# Patient Record
Sex: Male | Born: 1975 | Race: White | Hispanic: No | Marital: Married | Smoking: Former smoker
Health system: Southern US, Community
[De-identification: ages and names within clinical notes are randomized; demographics above are authoritative.]

## PROBLEM LIST (undated history)

## (undated) DIAGNOSIS — L089 Local infection of the skin and subcutaneous tissue, unspecified: Secondary | ICD-10-CM

## (undated) DIAGNOSIS — Z800 Family history of malignant neoplasm of digestive organs: Secondary | ICD-10-CM

## (undated) DIAGNOSIS — H7290 Unspecified perforation of tympanic membrane, unspecified ear: Secondary | ICD-10-CM

## (undated) DIAGNOSIS — S060X9A Concussion with loss of consciousness of unspecified duration, initial encounter: Secondary | ICD-10-CM

## (undated) DIAGNOSIS — J329 Chronic sinusitis, unspecified: Secondary | ICD-10-CM

## (undated) DIAGNOSIS — E669 Obesity, unspecified: Secondary | ICD-10-CM

## (undated) DIAGNOSIS — H9201 Otalgia, right ear: Secondary | ICD-10-CM

## (undated) DIAGNOSIS — L739 Follicular disorder, unspecified: Secondary | ICD-10-CM

## (undated) DIAGNOSIS — R079 Chest pain, unspecified: Secondary | ICD-10-CM

## (undated) HISTORY — PX: BACK SURGERY: SHX140

## (undated) HISTORY — DX: Chronic sinusitis, unspecified: J32.9

## (undated) HISTORY — DX: Follicular disorder, unspecified: L73.9

## (undated) HISTORY — DX: Local infection of the skin and subcutaneous tissue, unspecified: L08.9

## (undated) HISTORY — DX: Obesity, unspecified: E66.9

## (undated) HISTORY — DX: Concussion with loss of consciousness of unspecified duration, initial encounter: S06.0X9A

## (undated) HISTORY — DX: Otalgia, right ear: H92.01

## (undated) HISTORY — DX: Chest pain, unspecified: R07.9

## (undated) HISTORY — DX: Family history of malignant neoplasm of digestive organs: Z80.0

## (undated) HISTORY — PX: TYMPANOSTOMY TUBE PLACEMENT: SHX32

## (undated) HISTORY — DX: Unspecified perforation of tympanic membrane, unspecified ear: H72.90

---

## 2001-01-02 HISTORY — PX: BACK SURGERY: SHX140

## 2003-10-06 ENCOUNTER — Encounter: Admission: RE | Admit: 2003-10-06 | Discharge: 2003-10-06 | Payer: Self-pay | Admitting: Family Medicine

## 2004-11-16 ENCOUNTER — Emergency Department (HOSPITAL_COMMUNITY): Admission: EM | Admit: 2004-11-16 | Discharge: 2004-11-17 | Payer: Self-pay | Admitting: Emergency Medicine

## 2006-02-20 ENCOUNTER — Ambulatory Visit: Payer: Self-pay | Admitting: Family Medicine

## 2006-02-23 ENCOUNTER — Ambulatory Visit: Payer: Self-pay | Admitting: Family Medicine

## 2006-04-03 ENCOUNTER — Ambulatory Visit: Payer: Self-pay | Admitting: Gastroenterology

## 2006-04-03 LAB — CONVERTED CEMR LAB
ALT: 42 units/L — ABNORMAL HIGH (ref 0–40)
AST: 26 units/L (ref 0–37)
Albumin: 4.2 g/dL (ref 3.5–5.2)
Alkaline Phosphatase: 88 units/L (ref 39–117)
BUN: 11 mg/dL (ref 6–23)
Basophils Absolute: 0.1 10*3/uL (ref 0.0–0.1)
Basophils Relative: 1 % (ref 0.0–1.0)
Bilirubin, Direct: 0.1 mg/dL (ref 0.0–0.3)
CO2: 29 meq/L (ref 19–32)
Calcium: 10 mg/dL (ref 8.4–10.5)
Chloride: 102 meq/L (ref 96–112)
Creatinine, Ser: 0.8 mg/dL (ref 0.4–1.5)
Eosinophils Absolute: 0.2 10*3/uL (ref 0.0–0.6)
Eosinophils Relative: 3.2 % (ref 0.0–5.0)
GFR calc Af Amer: 143 mL/min
GFR calc non Af Amer: 118 mL/min
Glucose, Bld: 102 mg/dL — ABNORMAL HIGH (ref 70–99)
HCT: 47.8 % (ref 39.0–52.0)
Hemoglobin: 16.9 g/dL (ref 13.0–17.0)
Lymphocytes Relative: 18.5 % (ref 12.0–46.0)
MCHC: 35.2 g/dL (ref 30.0–36.0)
MCV: 91.6 fL (ref 78.0–100.0)
Monocytes Absolute: 0.4 10*3/uL (ref 0.2–0.7)
Monocytes Relative: 7.5 % (ref 3.0–11.0)
Neutro Abs: 4.6 10*3/uL (ref 1.4–7.7)
Neutrophils Relative %: 69.8 % (ref 43.0–77.0)
Platelets: 224 10*3/uL (ref 150–400)
Potassium: 4.9 meq/L (ref 3.5–5.1)
RBC: 5.22 M/uL (ref 4.22–5.81)
RDW: 11.6 % (ref 11.5–14.6)
Sodium: 137 meq/L (ref 135–145)
TSH: 1.41 microintl units/mL (ref 0.35–5.50)
Total Bilirubin: 0.9 mg/dL (ref 0.3–1.2)
Total Protein: 7.4 g/dL (ref 6.0–8.3)
WBC: 6.6 10*3/uL (ref 4.5–10.5)

## 2006-04-09 ENCOUNTER — Ambulatory Visit: Payer: Self-pay | Admitting: Family Medicine

## 2006-04-10 ENCOUNTER — Ambulatory Visit (HOSPITAL_COMMUNITY): Admission: RE | Admit: 2006-04-10 | Discharge: 2006-04-10 | Payer: Self-pay | Admitting: Gastroenterology

## 2006-04-10 ENCOUNTER — Ambulatory Visit: Payer: Self-pay | Admitting: Gastroenterology

## 2006-04-11 ENCOUNTER — Ambulatory Visit (HOSPITAL_COMMUNITY): Admission: RE | Admit: 2006-04-11 | Discharge: 2006-04-11 | Payer: Self-pay | Admitting: Gastroenterology

## 2006-04-18 ENCOUNTER — Ambulatory Visit: Payer: Self-pay | Admitting: Gastroenterology

## 2006-04-18 LAB — CONVERTED CEMR LAB
ALT: 40 units/L (ref 0–40)
AST: 26 units/L (ref 0–37)
Albumin: 4.2 g/dL (ref 3.5–5.2)
Alkaline Phosphatase: 97 units/L (ref 39–117)
Bilirubin, Direct: 0.2 mg/dL (ref 0.0–0.3)
Total Bilirubin: 1.3 mg/dL — ABNORMAL HIGH (ref 0.3–1.2)
Total Protein: 7.6 g/dL (ref 6.0–8.3)

## 2006-04-20 ENCOUNTER — Ambulatory Visit (HOSPITAL_COMMUNITY): Admission: RE | Admit: 2006-04-20 | Discharge: 2006-04-20 | Payer: Self-pay | Admitting: Gastroenterology

## 2006-08-17 DIAGNOSIS — K5730 Diverticulosis of large intestine without perforation or abscess without bleeding: Secondary | ICD-10-CM | POA: Insufficient documentation

## 2006-12-13 DIAGNOSIS — J069 Acute upper respiratory infection, unspecified: Secondary | ICD-10-CM | POA: Insufficient documentation

## 2006-12-16 DIAGNOSIS — H6690 Otitis media, unspecified, unspecified ear: Secondary | ICD-10-CM | POA: Insufficient documentation

## 2006-12-17 ENCOUNTER — Ambulatory Visit: Payer: Self-pay | Admitting: Family Medicine

## 2007-07-30 DIAGNOSIS — R1011 Right upper quadrant pain: Secondary | ICD-10-CM

## 2007-08-20 ENCOUNTER — Ambulatory Visit: Payer: Self-pay | Admitting: Family Medicine

## 2007-08-20 ENCOUNTER — Telehealth: Payer: Self-pay | Admitting: Gastroenterology

## 2007-08-21 ENCOUNTER — Ambulatory Visit: Payer: Self-pay | Admitting: Internal Medicine

## 2007-08-21 DIAGNOSIS — K219 Gastro-esophageal reflux disease without esophagitis: Secondary | ICD-10-CM | POA: Insufficient documentation

## 2007-08-21 DIAGNOSIS — R197 Diarrhea, unspecified: Secondary | ICD-10-CM | POA: Insufficient documentation

## 2007-08-21 DIAGNOSIS — R1031 Right lower quadrant pain: Secondary | ICD-10-CM | POA: Insufficient documentation

## 2007-08-22 ENCOUNTER — Ambulatory Visit: Payer: Self-pay | Admitting: Cardiology

## 2007-09-11 ENCOUNTER — Telehealth (INDEPENDENT_AMBULATORY_CARE_PROVIDER_SITE_OTHER): Payer: Self-pay | Admitting: *Deleted

## 2007-09-11 ENCOUNTER — Ambulatory Visit: Payer: Self-pay | Admitting: Gastroenterology

## 2007-09-13 ENCOUNTER — Encounter (INDEPENDENT_AMBULATORY_CARE_PROVIDER_SITE_OTHER): Payer: Self-pay | Admitting: *Deleted

## 2007-09-16 ENCOUNTER — Encounter: Payer: Self-pay | Admitting: Gastroenterology

## 2008-02-09 DIAGNOSIS — B303 Acute epidemic hemorrhagic conjunctivitis (enteroviral): Secondary | ICD-10-CM

## 2008-02-10 ENCOUNTER — Ambulatory Visit: Payer: Self-pay | Admitting: Family Medicine

## 2008-03-30 ENCOUNTER — Ambulatory Visit: Payer: Self-pay | Admitting: Family Medicine

## 2008-03-30 DIAGNOSIS — L255 Unspecified contact dermatitis due to plants, except food: Secondary | ICD-10-CM

## 2009-01-13 ENCOUNTER — Ambulatory Visit: Payer: Self-pay | Admitting: Family Medicine

## 2009-01-13 DIAGNOSIS — J020 Streptococcal pharyngitis: Secondary | ICD-10-CM

## 2009-01-13 LAB — CONVERTED CEMR LAB: Rapid Strep: POSITIVE

## 2009-08-13 ENCOUNTER — Emergency Department (HOSPITAL_COMMUNITY)
Admission: EM | Admit: 2009-08-13 | Discharge: 2009-08-13 | Payer: Self-pay | Source: Home / Self Care | Admitting: Family Medicine

## 2009-08-16 ENCOUNTER — Telehealth: Payer: Self-pay | Admitting: Family Medicine

## 2010-01-22 ENCOUNTER — Encounter: Payer: Self-pay | Admitting: Family Medicine

## 2010-01-31 ENCOUNTER — Telehealth: Payer: Self-pay | Admitting: Family Medicine

## 2010-02-03 NOTE — Progress Notes (Signed)
Summary: Call A Nurse Report    Triage Call Report Triage Record Num: [id number] Operator: Octaviano Glow Patient Name: Martin Hancock Call Date & Time: 08/13/2009 7:35:59PM Patient Phone: [phone] PCP: Eugenio Hoes. Todd Patient Gender: Male PCP Fax : [phone] Patient DOB: October 27, 1975 Practice Name: [facility] Reason for Call: Pt calling about having an episode while looking at his computer, eyes got blurry with blind spots. Vision changes lasted for about 45-min. When vision returned to normal he got a severe headache behind his right eye. Advised to go to [facility] ED. Protocol(s) Used: Eye: Pain or Vision Change Recommended Outcome per Protocol: See ED Immediately Reason for Outcome: Sudden onset of severe pain in or around or behind eye(s) Care Advice:  ~ Another adult should drive.  ~ Do not give the patient anything to eat or drink.  ~ Wear dark UV-blocking sunglasses to protect eyes from sun exposure or for light sensitivity.  ~ IMMEDIATE ACTION 08/13/2009 7:42:30PM Page 1 of 1 [id number]

## 2010-02-03 NOTE — Assessment & Plan Note (Signed)
Summary: ? strep//ccm   Vital Signs:  Patient profile:   35 year old male Weight:      253 pounds Temp:     98 degrees F Pulse rate:   70 / minute Resp:     12 per minute BP sitting:   108 / 80  Primary Care Provider:  Kelle Darting, MD   History of Present Illness:  Martin Hancock is a 35 year old, married male, nonsmoker, who comes in with a 4-day history of sore throat.  He states his wife was diagnosed as strep throat on Mony.  It's been going to his daughter's daycare, but his child has been well, and strep test on the daughter was negative.  He said, fever, sore throat for 3 days.  No cough.  He's also developed a right-sided earache  Allergies: No Known Drug Allergies  Past History:  Past medical, surgical, family and social histories (including risk factors) reviewed for relevance to current acute and chronic problems.  Past Medical History: Reviewed history from 08/21/2007 and no changes required. Diverticulosis, colon  Past Surgical History: Reviewed history from 08/21/2007 and no changes required. Lumbar surgery  Family History: Reviewed history from 08/21/2007 and no changes required. No FH of Colon Cancer:  Social History: Reviewed history from 08/21/2007 and no changes required. Occupation:bank manager Married Current Smoker Alcohol use-yes Illicit Drug Use - no Daily Caffeine Use Patient gets regular exercise.  Review of Systems      See HPI  Physical Exam  General:  Well-developed,well-nourished,in no acute distress; alert,appropriate and cooperative throughout examination Head:  Normocephalic and atraumatic without obvious abnormalities. No apparent alopecia or balding. Eyes:  No corneal or conjunctival inflammation noted. EOMI. Perrla. Funduscopic exam benign, without hemorrhages, exudates or papilledema. Vision grossly normal. Ears:  right ear shows evidence of previous ruptured tympanic membrane that is healed over.  Left normal Nose:  External  nasal examination shows no deformity or inflammation. Nasal mucosa are pink and moist without lesions or exudates. Mouth:  Oral mucosa and oropharynx without lesions or exudates.  Teeth in good repair. Neck:  No deformities, masses, or tenderness noted. Lungs:  Normal respiratory effort, chest expands symmetrically. Lungs are clear to auscultation, no crackles or wheezes. Cervical Nodes:  No lymphadenopathy noted   Problems:  Medical Problems Added: 1)  Dx of Strep Throat  (ICD-034.0)  Impression & Recommendations:  Problem # 1:  STREP THROAT (ICD-034.0) Assessment New  His updated medication list for this problem includes:    Trimox 500 Mg Caps (Amoxicillin) .Marland Kitchen... Take 1 tablet by mouth three times a day  Orders: Prescription Created Electronically [phone]) Rapid Strep (60737)  Complete Medication List: 1)  Prednisone 20 Mg Tabs (Prednisone) .... Uad 2)  Trimox 500 Mg Caps (Amoxicillin) .... Take 1 tablet by mouth three times a day 3)  Hydromet 5-1.5 Mg/20ml Syrp (Hydrocodone-homatropine) .Marland Kitchen.. 1 or 2 tsps three times a day as needed  Patient Instructions: 1)  Get plenty of rest, drink lots of clear liquids, and use Tylenol or Ibuprofen for fever and comfort. Return in 7-10 days if you're not better:sooner if you're feeling worse. 2)  Take 650-1000mg  of Tylenol every 4-6 hours as needed for relief of pain or comfort of fever AVOID taking more than 4000mg   in a 24 hour period (can cause liver damage in higher doses). 3)  begin amoxicillin, 500 mg 3 times a day for 10 days.  Also, one or 2 teaspoons of Hydromet, up to 3 times a day as  needed for cough and cold symptoms.  Return p.r.n. Prescriptions: TRIMOX 500 MG CAPS (AMOXICILLIN) Take 1 tablet by mouth three times a day  #30 x 0   Entered and Authorized by:   Roderick Pee MD   Signed by:   Roderick Pee MD on 01/13/2009   Method used:   Print then Give to Patient   RxID:   [id number] HYDROMET 5-1.5 MG/5ML SYRP  (HYDROCODONE-HOMATROPINE) 1 or 2 tsps three times a day as needed  #8oz x 1   Entered and Authorized by:   Roderick Pee MD   Signed by:   Roderick Pee MD on 01/13/2009   Method used:   Print then Give to Patient   RxID:   [id number] TRIMOX 500 MG CAPS (AMOXICILLIN) Take 1 tablet by mouth three times a day  #30 x 0   Entered and Authorized by:   Roderick Pee MD   Signed by:   Roderick Pee MD on 01/13/2009   Method used:   Electronically to        [facility] [address] [phone]* (retail)       [address]       [place], [place]  [address]       Ph: [phone] or [phone]       Fax: [phone]   RxID:   [id number]

## 2010-02-03 NOTE — Assessment & Plan Note (Signed)
Summary: ? strep//ccm   Vital Signs:  Patient profile:   35 year old male Weight:      253 pounds Temp:     98.9 degrees F oral BP sitting:   108 / 80  (right arm) Cuff size:   large  Vitals Entered By: Raechel Ache, RN (January 13, 2009 12:13 PM) CC: C/o bad sore throat, R earache.   CC:  C/o bad sore throat and R earache..  Allergies: No Known Drug Allergies   Complete Medication List: 1)  Prednisone 20 Mg Tabs (Prednisone) .... Uad  Other Orders: Rapid Strep (45409) No Charge Patient Arrived (NCPA0) (NCPA0)  Laboratory Results    Other Tests  Rapid Strep: positive Comments: Joanne Chars CMA  January 13, 2009 12:22 PM

## 2010-02-03 NOTE — Letter (Signed)
Summary: Out of Work  Adult nurse at [employer]  [address]   [place], [place] [address]   Phone: [phone]  Fax: [phone]    January 13, 2009   Employee:  MASSIE MEES    To Whom It May Concern:   For Medical reasons, please excuse the above named employee from work for the following dates:  Start:   Monday January 11, 2009  End:   Friday January 15, 2009  If you need additional information, please feel free to contact our office.         Sincerely,    Roderick Pee MD

## 2010-02-09 ENCOUNTER — Encounter: Payer: Self-pay | Admitting: Family Medicine

## 2010-02-09 NOTE — Progress Notes (Signed)
Summary: Pt needs to get an order to have ua test done at [facility]  Phone Note Call from Patient Call back at [phone]    Caller: Patient Summary of Call: Pt called and needs a doctors order to get a ua test for nicotin, at [facility] [address] [phone] Initial call taken by: Lucy Antigua,  January 31, 2010 3:45 PM  Follow-up for Phone Call        ok Follow-up by: Roderick Pee MD,  January 31, 2010 4:58 PM  Additional Follow-up for Phone Call Additional follow up Details #1::        Phone Call Completed Additional Follow-up by: Kern Reap CMA Duncan Dull),  February 01, 2010 1:17 PM  New Problems: SCREENING CHEMICAL POISONING&OTHER CONTAMINATION (ICD-V82.5)   New Problems: SCREENING CHEMICAL POISONING&OTHER CONTAMINATION (ICD-V82.5)

## 2010-02-22 ENCOUNTER — Telehealth: Payer: Self-pay | Admitting: *Deleted

## 2010-02-22 NOTE — Telephone Encounter (Signed)
faxed

## 2010-02-22 NOTE — Telephone Encounter (Signed)
Fax to [phone] [name] Insurance Needs UA results from [facility] back in Jan.  2012.

## 2010-02-22 NOTE — Telephone Encounter (Signed)
Okay to send copy of urinalysis

## 2010-03-18 LAB — GLUCOSE, CAPILLARY: Glucose-Capillary: 88 mg/dL (ref 70–99)

## 2010-05-20 NOTE — Assessment & Plan Note (Signed)
[facility]                         GASTROENTEROLOGY OFFICE NOTE   IMIR, BRUMBACH                     MRN:          [id number]  DATE:04/03/2006                            DOB:          1975/10/13    REASON FOR REFERRAL:  Dr. Tawanna Cooler asked me to evaluate Mr. Dannemiller regarding  diverticulitis, dysphagia.   HISTORY OF PRESENT ILLNESS:  Mr. Klingensmith is a very pleasant 35 year old  man who began having right lower quadrant pains that radiated towards  the left side of his abdomen, approximately 2 months ago.  He had some  hot and cold flashes throughout this, he was mildly nauseous.  He was  quite fatigued.  This lasted for at least 2 or 3 weeks before he  presented to see his primary care physician, Dr. Tawanna Cooler.  Dr. Tawanna Cooler  examined him and felt that he had diverticulitis, put him on a liquid  diet, and started him on Citrucel, as well as oral Cipro and Flagyl.  The pain has gradually improved, and he has weaned himself down to 1  tbsp of Citrucel daily, and he is back on normal diet.  The pains have  gone.  He had a similar attack of right lower quadrant pains 2-3 years  ago.  He had an ultrasound around this time, and it was a normal  examination.  He has not had lab tests or imaging studies since then.   He also complains of intermittent solid food dysphagia.  It happens  approximately once or twice a week, nonprogressive.  He does get  heartburn periodically, about once a month, and Tums seems to help very  well.  He never had overt food impaction.   REVIEW OF SYSTEMS:  Notable for overall stable weight for the past 9-10  months.  The rest of his review of systems is essentially normal, and is  available on his nursing intake sheet.   PAST MEDICAL HISTORY:  Tubes in his ears and a back surgery, otherwise  normal.   CURRENT MEDICATIONS:  Citrucel once daily.   ALLERGIES:  No known drug allergies.   SOCIAL HISTORY:  Married with no children.   Lives with his wife, works  as a Public librarian.  Smokes 1 pack of cigarettes a day, drinks 1-2 beers  a day.   FAMILY HISTORY:  No colon cancer, colon polyps in family.   PHYSICAL EXAMINATION:  Height 6 feet 0 inches, 266 pounds, blood  pressure 122/80, pulse 85.  CONSTITUTIONAL:  Generally well-appearing.  NEUROLOGIC:  Alert and oriented x3.  EYES:  Extraocular movements intact.  MOUTH:  Oropharynx moist, no lesions.  NECK:  Supple, no lymphadenopathy.  CARDIOVASCULAR:  Heart regular rate and rhythm.  LUNGS:  Clear to auscultation bilaterally.  ABDOMEN:  Soft, nontender, nondistended.  Normal bowel sounds.  EXTREMITIES:  No lower extremity edema.  SKIN:  No rashes or lesions on visible extremities.   ASSESSMENT AND PLAN:  A 35 year old man with clinically diagnosed  diverticulitis, intermittent solid food dysphagia.   He may, indeed, have had diverticulitis, although without imaging proof  I remain slightly skeptical about  this.  He is pain free now, and so I  do not think doing a CT scan is worthwhile at this point, but I will  arrange for him to have a colonoscopy to see if he, indeed, has the  presence of diverticulosis.  He has started Citrucel, and that seemed to  even out his bowels a bit.  He is going with less straining overall.  Will also get a set of labs including a CBC, a complete metabolic  profile, and thyroid testing done today.  For his dysphagia, I will  arrange for him for have an upper gastrointestinal evaluation to see if  he has any esophageal stricturing.  If he, indeed, has stricturing, then  we will have to proceed with endoscopy as well.     Rachael Fee, MD  Electronically Signed    DPJ/MedQ  DD: 04/03/2006  DT: 04/03/2006  Job #: [id number]   cc:   Tinnie Gens A. Tawanna Cooler, MD

## 2012-04-11 ENCOUNTER — Encounter (HOSPITAL_COMMUNITY): Payer: Self-pay | Admitting: Emergency Medicine

## 2012-04-11 ENCOUNTER — Emergency Department (INDEPENDENT_AMBULATORY_CARE_PROVIDER_SITE_OTHER): Payer: BC Managed Care – PPO

## 2012-04-11 ENCOUNTER — Emergency Department (INDEPENDENT_AMBULATORY_CARE_PROVIDER_SITE_OTHER)
Admission: EM | Admit: 2012-04-11 | Discharge: 2012-04-11 | Disposition: A | Discharge status: Home / Self Care | Payer: Self-pay | Source: Home / Self Care | Attending: Emergency Medicine | Admitting: Emergency Medicine

## 2012-04-11 DIAGNOSIS — M5412 Radiculopathy, cervical region: Secondary | ICD-10-CM

## 2012-04-11 DIAGNOSIS — R0789 Other chest pain: Secondary | ICD-10-CM

## 2012-04-11 MED ORDER — TRAMADOL HCL 50 MG PO TABS
100.0000 mg | ORAL_TABLET | Freq: Three times a day (TID) | ORAL | Status: DC | PRN
Start: 2012-04-11 — End: 2016-12-12

## 2012-04-11 MED ORDER — METHOCARBAMOL 500 MG PO TABS: 500.0000 mg | ORAL_TABLET | Freq: Three times a day (TID) | ORAL | Status: DC

## 2012-04-11 MED ORDER — MELOXICAM 15 MG PO TABS: 15.0000 mg | ORAL_TABLET | Freq: Every day | ORAL | Status: DC

## 2012-04-11 NOTE — ED Notes (Signed)
Pt c/o right shoulder/back pain onset 2 hours ago Recalls waking up while son was on his chest and noticed pain Pain gradually getting worse; hurts to raise arm above head and pain increases w/activity Reports tingly and numbness of right arm Took 600 mg of ibuprofen about 2 hours ago Denies: inj/trauma  He is alert and oriented w/no signs of acute distress.
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
## 2012-11-07 ENCOUNTER — Other Ambulatory Visit: Payer: Self-pay

## 2015-02-18 ENCOUNTER — Encounter: Payer: Self-pay | Admitting: Gastroenterology

## 2015-04-19 ENCOUNTER — Ambulatory Visit: Payer: Self-pay | Admitting: Gastroenterology

## 2016-09-22 ENCOUNTER — Encounter: Payer: Self-pay | Admitting: Family Medicine

## 2016-12-12 ENCOUNTER — Ambulatory Visit (INDEPENDENT_AMBULATORY_CARE_PROVIDER_SITE_OTHER): Payer: BC Managed Care – PPO

## 2016-12-12 ENCOUNTER — Ambulatory Visit (HOSPITAL_COMMUNITY)
Admission: EM | Admit: 2016-12-12 | Discharge: 2016-12-12 | Disposition: A | Discharge status: Home / Self Care | Payer: BC Managed Care – PPO | Attending: Emergency Medicine | Admitting: Emergency Medicine

## 2016-12-12 ENCOUNTER — Encounter (HOSPITAL_COMMUNITY): Payer: Self-pay | Admitting: Emergency Medicine

## 2016-12-12 ENCOUNTER — Other Ambulatory Visit: Payer: Self-pay

## 2016-12-12 DIAGNOSIS — S8992XA Unspecified injury of left lower leg, initial encounter: Secondary | ICD-10-CM

## 2016-12-12 DIAGNOSIS — M25462 Effusion, left knee: Secondary | ICD-10-CM | POA: Diagnosis not present

## 2016-12-12 MED ORDER — TRAMADOL HCL 50 MG PO TABS: 50.0000 mg | ORAL_TABLET | Freq: Four times a day (QID) | ORAL | 0 refills | Status: DC | PRN

## 2016-12-12 MED ORDER — MELOXICAM 15 MG PO TABS: 15.0000 mg | ORAL_TABLET | Freq: Every day | ORAL | 0 refills | Status: DC

## 2016-12-12 NOTE — ED Provider Notes (Signed)
[facility]    CSN: [id number] Arrival date & time: 12/12/16  1120     History   Chief Complaint Chief Complaint  Patient presents with  . Knee Injury    left    HPI JALEEN FINCH is a 41 y.o. male.   41 year old male comes in with spouse for left knee injury. States he was playing in the snow with his kids yesterday and twisted his left knee. He heard popping sounds from the knee. States he waited for awhile, but was eventually able to weight bear and limp back into the house. He took some NSAIDs with little relief. He has been doing ice compress to help with swelling. States he has some numbness and tingling of his foot that resolves after movement. States painful range of motion and hard to straighten the knee.       History reviewed. No pertinent past medical history.  Patient Active Problem List   Diagnosis Date Noted  . STREP THROAT 01/13/2009  . CONTACT DERMATITIS&OTHER ECZEMA DUE TO PLANTS 03/30/2008  . EPIDEMIC HEMORRHAGIC CONJUNCTIVITIS 02/09/2008  . GERD 08/21/2007  . DIARRHEA 08/21/2007  . ABDOMINAL PAIN-RLQ 08/21/2007  . ABDOMINAL PAIN RIGHT UPPER QUADRANT 07/30/2007  . ROM 12/16/2006  . VIRAL URI 12/13/2006  . DIVERTICULOSIS, COLON 08/17/2006    Past Surgical History:  Procedure Laterality Date  . BACK SURGERY         Home Medications    Prior to Admission medications   Medication Sig Start Date End Date Taking? Authorizing Provider  ibuprofen (ADVIL,MOTRIN) 800 MG tablet Take 800 mg by mouth every 8 (eight) hours as needed.   Yes [provider]  meloxicam (MOBIC) 15 MG tablet Take 1 tablet (15 mg total) by mouth daily. 12/12/16   Tasia Catchings, Amy V, PA-C  traMADol (ULTRAM) 50 MG tablet Take 1 tablet (50 mg total) by mouth every 6 (six) hours as needed. 12/12/16   Ok Edwards, PA-C    Family History Family History  Problem Relation Age of Onset  . Colon cancer Father   . Liver cancer Father   . Diabetes Mother     Social  History Social History   Tobacco Use  . Smoking status: Former Research scientist (life sciences)  . Smokeless tobacco: Never Used  Substance Use Topics  . Alcohol use: Yes    Alcohol/week: 0.0 oz  . Drug use: No     Allergies   Patient has no known allergies.   Review of Systems Review of Systems  Reason unable to perform ROS: See HPI as above.     Physical Exam Triage Vital Signs ED Triage Vitals  Enc Vitals Group     BP 12/12/16 1136 (!) 131/51     Pulse Rate 12/12/16 1136 90     Resp --      Temp 12/12/16 1136 98.5 F (36.9 C)     Temp Source 12/12/16 1136 Oral     SpO2 12/12/16 1136 95 %     Weight --      Height --      Head Circumference --      Peak Flow --      Pain Score 12/12/16 1134 7     Pain Loc --      Pain Edu? --      Excl. in [place]? --    No data found.  Updated Vital Signs BP (!) 131/51 (BP Location: Left Arm)   Pulse 90   Temp  98.5 F (36.9 C) (Oral)   SpO2 95%   Physical Exam  Constitutional: He is oriented to person, place, and time. He appears well-developed and well-nourished. No distress.  HENT:  Head: Normocephalic and atraumatic.  Eyes: Conjunctivae are normal. Pupils are equal, round, and reactive to light.  Musculoskeletal:  Diffuse swelling of the medial left knee. No erythema, increased warmth. Tenderness on palpation along medial joint line. Limited ROM of knee due to pain. Strength testing deferred. Sensation intact and equal bilaterally. Pedal pulses 2+ and equal bilaterally. Cap refill <2s.   Neurological: He is alert and oriented to person, place, and time.     UC Treatments / Results  Labs (all labs ordered are listed, but only abnormal results are displayed) Labs Reviewed - No data to display  EKG  EKG Interpretation None       Radiology Dg Knee Complete 4 Views Left  Result Date: 12/12/2016 CLINICAL DATA:  [place] and twisted knee.  Pain. EXAM: LEFT KNEE - COMPLETE 4+ VIEW COMPARISON:  None. FINDINGS: No fracture or dislocation.  Positive for joint effusion. Soft tissues otherwise unremarkable. IMPRESSION: A joint effusion is present, but no fracture is visualized. Electronically Signed   By: Staci Righter M.D.   On: 12/12/2016 13:31    Procedures Procedures (including critical care time)  Medications Ordered in UC Medications - No data to display   Initial Impression / Assessment and Plan / UC Course  I have reviewed the triage vital signs and the nursing notes.  Pertinent labs & imaging results that were available during my care of the patient were reviewed by me and considered in my medical decision making (see chart for details).    Xray negative for fracture/dislocation, does show joint effusion. Discussed with patient mechanism of injury concerning of meniscus/ligament injuries. Mobic for pain. Tramadol for break through pain. Crutches as needed for weight bearing. Patient to follow up with orthopedics as soon as possible for further evaluation and treatment needed. Resources given, including orthopedic urgent care. Return precautions given.  Discussed case with Dr Marcille Blanco, who agrees to plan.   Final Clinical Impressions(s) / UC Diagnoses   Final diagnoses:  Injury of left knee, initial encounter    ED Discharge Orders        Ordered    meloxicam (MOBIC) 15 MG tablet  Daily     12/12/16 1349    traMADol (ULTRAM) 50 MG tablet  Every 6 hours PRN     12/12/16 1349       Controlled Substance Prescriptions [place] Controlled Substance Registry consulted? Yes, I have consulted the [place] Controlled Substances Registry for this patient, and feel the risk/benefit ratio today is favorable for proceeding with this prescription for a controlled substance.   Ok Edwards, PA-C 12/12/16 1430

## 2016-12-12 NOTE — Discharge Instructions (Signed)
Xray negative for fracture/dislocation. Given mechanism of injury, concerns for meniscus injury, ligament injury. Please follow up with orthopedics as soon as possible for further evaluation and management needed. Mobic as directed. Tramadol for breakthrough pain. Monitor for worsening symptoms, decreased feeling in the foot, changes in foot color, follow up for reevalaution.

## 2016-12-12 NOTE — ED Triage Notes (Signed)
Pt was playing in the snow with his kids when he twisted his left knee.  He states he went one way and the went the other and he heard several popping sounds from the knee.

## 2018-05-01 ENCOUNTER — Other Ambulatory Visit: Payer: Self-pay

## 2018-05-01 ENCOUNTER — Encounter: Payer: Self-pay | Admitting: Family Medicine

## 2018-05-01 ENCOUNTER — Ambulatory Visit (INDEPENDENT_AMBULATORY_CARE_PROVIDER_SITE_OTHER): Payer: BC Managed Care – PPO | Admitting: Family Medicine

## 2018-05-01 VITALS — Ht 72.0 in | Wt 276.0 lb

## 2018-05-01 DIAGNOSIS — Z833 Family history of diabetes mellitus: Secondary | ICD-10-CM | POA: Insufficient documentation

## 2018-05-01 DIAGNOSIS — Z9889 Other specified postprocedural states: Secondary | ICD-10-CM | POA: Insufficient documentation

## 2018-05-01 DIAGNOSIS — M25552 Pain in left hip: Secondary | ICD-10-CM

## 2018-05-01 DIAGNOSIS — Z8049 Family history of malignant neoplasm of other genital organs: Secondary | ICD-10-CM | POA: Insufficient documentation

## 2018-05-01 DIAGNOSIS — W19XXXA Unspecified fall, initial encounter: Secondary | ICD-10-CM

## 2018-05-01 DIAGNOSIS — J3089 Other allergic rhinitis: Secondary | ICD-10-CM | POA: Insufficient documentation

## 2018-05-01 DIAGNOSIS — Z8489 Family history of other specified conditions: Secondary | ICD-10-CM

## 2018-05-01 DIAGNOSIS — Z87891 Personal history of nicotine dependence: Secondary | ICD-10-CM | POA: Diagnosis not present

## 2018-05-01 DIAGNOSIS — Z803 Family history of malignant neoplasm of breast: Secondary | ICD-10-CM | POA: Insufficient documentation

## 2018-05-01 DIAGNOSIS — M79605 Pain in left leg: Secondary | ICD-10-CM

## 2018-05-01 DIAGNOSIS — Z800 Family history of malignant neoplasm of digestive organs: Secondary | ICD-10-CM

## 2018-05-01 DIAGNOSIS — E669 Obesity, unspecified: Secondary | ICD-10-CM | POA: Diagnosis not present
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
## 2018-05-02 ENCOUNTER — Encounter: Payer: Self-pay | Admitting: Sports Medicine

## 2018-05-02 ENCOUNTER — Ambulatory Visit
Admission: RE | Admit: 2018-05-02 | Discharge: 2018-05-02 | Disposition: A | Discharge status: Home / Self Care | Payer: BC Managed Care – PPO | Source: Ambulatory Visit | Attending: Sports Medicine | Admitting: Sports Medicine

## 2018-05-02 ENCOUNTER — Ambulatory Visit: Payer: BC Managed Care – PPO | Admitting: Sports Medicine

## 2018-05-02 VITALS — BP 117/47 | Ht 72.0 in | Wt 280.0 lb

## 2018-05-02 DIAGNOSIS — M25552 Pain in left hip: Secondary | ICD-10-CM

## 2018-05-02 DIAGNOSIS — M79605 Pain in left leg: Secondary | ICD-10-CM

## 2018-05-02 MED ORDER — GABAPENTIN 300 MG PO CAPS: 300.0000 mg | ORAL_CAPSULE | Freq: Every day | ORAL | 0 refills | Status: DC
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
## 2018-05-02 NOTE — Patient Instructions (Signed)
Phylliss Bob, MD [facility] [address] [place], [place] [address] Phone: [phone] Tuesday, May 5th at 130pm

## 2018-10-21 ENCOUNTER — Encounter: Payer: Self-pay | Admitting: Gastroenterology

## 2018-10-21 ENCOUNTER — Other Ambulatory Visit: Payer: Self-pay

## 2018-10-21 ENCOUNTER — Encounter: Payer: Self-pay | Admitting: Family Medicine

## 2018-10-21 ENCOUNTER — Ambulatory Visit (INDEPENDENT_AMBULATORY_CARE_PROVIDER_SITE_OTHER): Payer: BC Managed Care – PPO | Admitting: Family Medicine

## 2018-10-21 VITALS — BP 110/72 | HR 97 | Temp 98.1°F | Resp 16 | Ht 72.0 in | Wt 281.1 lb

## 2018-10-21 DIAGNOSIS — Z23 Encounter for immunization: Secondary | ICD-10-CM

## 2018-10-21 DIAGNOSIS — G5601 Carpal tunnel syndrome, right upper limb: Secondary | ICD-10-CM | POA: Insufficient documentation

## 2018-10-21 DIAGNOSIS — X503XXA Overexertion from repetitive movements, initial encounter: Secondary | ICD-10-CM

## 2018-10-21 DIAGNOSIS — G8929 Other chronic pain: Secondary | ICD-10-CM

## 2018-10-21 DIAGNOSIS — M7581 Other shoulder lesions, right shoulder: Secondary | ICD-10-CM | POA: Insufficient documentation

## 2018-10-21 DIAGNOSIS — M25511 Pain in right shoulder: Secondary | ICD-10-CM | POA: Insufficient documentation

## 2018-10-21 DIAGNOSIS — M7711 Lateral epicondylitis, right elbow: Secondary | ICD-10-CM | POA: Diagnosis not present

## 2018-10-21 DIAGNOSIS — M7541 Impingement syndrome of right shoulder: Secondary | ICD-10-CM | POA: Diagnosis not present

## 2018-10-21 DIAGNOSIS — Z800 Family history of malignant neoplasm of digestive organs: Secondary | ICD-10-CM

## 2018-10-21 MED ORDER — MELOXICAM 7.5 MG PO TABS: 7.5000 mg | ORAL_TABLET | ORAL | 0 refills | Status: DC | PRN

## 2018-10-21 NOTE — Patient Instructions (Signed)
Tennis Elbow    Tennis elbow (lateral epicondylitis) is inflammation of tendons in your outer forearm, near your elbow. Tendons are tissues that connect muscle to bone. When you have tennis elbow, inflammation affects the tendons that you use to bend your wrist and move your hand up. Inflammation occurs in the lower part of the upper arm bone (humerus), where the tendons connect to the bone (lateral epicondyle).  Tennis elbow often affects people who play tennis, but anyone may get the condition from repeatedly extending the wrist or turning the forearm.  What are the causes?  This condition is usually caused by repeatedly extending the wrist, turning the forearm, and using the hands. It can result from sports or work that requires repetitive forearm movements. In some cases, it may be caused by a sudden injury.  What increases the risk?  You are more likely to develop tennis elbow if you play tennis or another racket sport. You also have a higher risk if you frequently use your hands for work. Besides people who play tennis, others at greater risk include:  Musicians.  Carpenters, painters, and plumbers.  Cooks.  Cashiers.  People who work in factories.  Construction workers.  Butchers.  People who use computers.  What are the signs or symptoms?  Symptoms of this condition include:  Pain and tenderness in the forearm and the outer part of the elbow. Pain may be felt only when using the arm, or it may be there all the time.  A burning feeling that starts in the elbow and spreads down the forearm.  A weak grip in the hand.  How is this diagnosed?  This condition may be diagnosed based on:  Your symptoms and medical history.  A physical exam.  X-rays.  MRI.  How is this treated?  Resting and icing your arm is often the first treatment. Your health care provider may also recommend:  Medicines to reduce pain and inflammation. These may be in the form of a pill, topical gels, or shots of a steroid medicine  (cortisone).  An elbow strap to reduce stress on the area.  Physical therapy. This may include massage or exercises.  An elbow brace to restrict the movements that cause symptoms.  If these treatments do not help relieve your symptoms, your health care provider may recommend surgery to remove damaged muscle and reattach healthy muscle to bone.  Follow these instructions at home:  Activity  Rest your elbow and wrist and avoid activities that cause symptoms, as told by your health care provider.  Do physical therapy exercises as instructed.  If you lift an object, lift it with your palm facing up. This reduces stress on your elbow.  Lifestyle  If your tennis elbow is caused by sports, check your equipment and make sure that:  You are using it correctly.  It is the best fit for you.  If your tennis elbow is caused by work or computer use, take frequent breaks to stretch your arm. Talk with your manager about ways to manage your condition at work.  If you have a brace:  Wear the brace or strap as told by your health care provider. Remove it only as told by your health care provider.  Loosen the brace if your fingers tingle, become numb, or turn cold and blue.  Keep the brace clean.  If the brace is not waterproof, ask if you may remove it for bathing. If you must keep the brace on while bathing:    bath or a shower. General instructions   If directed, put ice on the painful area: ? Put ice in a plastic bag. ? Place a towel between your skin and the bag. ? Leave the ice on for 20 minutes, 2-3 times a day.  Take over-the-counter and prescription medicines only as told by your health care provider.  Keep all follow-up visits as told by your health care provider. This is important. Contact a health care provider if:  You have pain that gets worse or does not get better with treatment.   You have numbness or weakness in your forearm, hand, or fingers. Summary  Tennis elbow (lateral epicondylitis) is inflammation of tendons in your outer forearm, near your elbow.  Common symptoms include pain and tenderness in your forearm and the outer part of your elbow.  This condition is usually caused by repeatedly extending your wrist, turning your forearm, and using your hands.  The first treatment is often resting and icing your arm to relieve symptoms. Further treatment may include taking medicine, getting physical therapy, wearing a brace or strap, or having surgery. This information is not intended to replace advice given to you by your health care provider. Make sure you discuss any questions you have with your health care provider. Document Released: 12/19/2004 Document Revised: 09/14/2017 Document Reviewed: 10/03/2016 Elsevier Patient Education  2020 [employer].     Shoulder Impingement Syndrome  Shoulder impingement syndrome is a condition that causes pain when connective tissues (tendons) surrounding the shoulder joint become pinched. These tendons are part of the group of muscles and tissues that help to stabilize the shoulder (rotator cuff). Beneath the rotator cuff is a fluid-filled sac (bursa) that allows the muscles and tendons to glide smoothly. The bursa may become swollen or irritated (bursitis). Bursitis, swelling in the rotator cuff tendons, or both conditions can decrease how much space is under a bone in the shoulder joint (acromion), resulting in impingement. What are the causes? Shoulder impingement syndrome may be caused by bursitis or swelling of the rotator cuff tendons, which may result from:  Repetitive overhead arm movements.  Falling onto the shoulder.  Weakness in the shoulder muscles. What increases the risk? You may be more likely to develop this condition if you:  Play sports that involve throwing, such as baseball.  Participate in sports  such as tennis, volleyball, and swimming.  Work as a Curator, Games developer, or Architect. Some people are also more likely to develop impingement syndrome because of the shape of their acromion bone. What are the signs or symptoms? The main symptom of this condition is pain on the front or side of the shoulder. The pain may:  Get worse when lifting or raising the arm.  Get worse at night.  Wake you up from sleeping.  Feel sharp when the shoulder is moved and then fade to an ache. Other symptoms may include:  Tenderness.  Stiffness.  Inability to raise the arm above shoulder level or behind the body.  Weakness. How is this diagnosed? This condition may be diagnosed based on:  Your symptoms and medical history.  A physical exam.  Imaging tests, such as: ? X-rays. ? MRI. ? Ultrasound. How is this treated? This condition may be treated by:  Resting your shoulder and avoiding all activities that cause pain or put stress on the shoulder.  Icing your shoulder.  NSAIDs to help reduce pain and swelling.  One or more injections of medicines to numb the area and  reduce inflammation.  Physical therapy.  Surgery. This may be needed if nonsurgical treatments have not helped. Surgery may involve repairing the rotator cuff, reshaping the acromion, or removing the bursa. Follow these instructions at home: Managing pain, stiffness, and swelling   If directed, put ice on the injured area. ? Put ice in a plastic bag. ? Place a towel between your skin and the bag. ? Leave the ice on for 20 minutes, 2-3 times a day. Activity  Rest and return to your normal activities as told by your health care provider. Ask your health care provider what activities are safe for you.  Do exercises as told by your health care provider. General instructions  Do not use any products that contain nicotine or tobacco, such as cigarettes, e-cigarettes, and chewing tobacco. These can delay healing.  If you need help quitting, ask your health care provider.  Ask your health care provider when it is safe for you to drive.  Take over-the-counter and prescription medicines only as told by your health care provider.  Keep all follow-up visits as told by your health care provider. This is important. How is this prevented?  Give your body time to rest between periods of activity.  Be safe and responsible while being active. This will help you avoid falls.  Maintain physical fitness, including strength and flexibility. Contact a health care provider if:  Your symptoms have not improved after 1-2 months of treatment and rest.  You cannot lift your arm away from your body. Summary  Shoulder impingement syndrome is a condition that causes pain when connective tissues (tendons) surrounding the shoulder joint become pinched.  The main symptom of this condition is pain on the front or side of the shoulder.  This condition is usually treated with rest, ice, and pain medicines as needed. This information is not intended to replace advice given to you by your health care provider. Make sure you discuss any questions you have with your health care provider. Document Released: 12/19/2004 Document Revised: 04/12/2018 Document Reviewed: 06/13/2017 Elsevier Patient Education  2020 [employer].

## 2018-10-21 NOTE — Progress Notes (Signed)
Impression and Recommendations:    1. Chronic right shoulder pain   2. Rotator cuff tendinitis, right   3. Impingement syndrome of right shoulder   4. Overuse injury   5. Right tennis elbow   6. h/o Carpal tunnel syndrome of right wrist   7. Family history of colon cancer in father- in early 4's   8. Need for immunization against influenza     - Need for influenza vaccination.  Chronic Right Shoulder Pain, Right Tennis Elbow History of Carpal Tunnel Syndrome, Right Wrist - Extensively discussed sx with patient during appointment. - Reviewed prudent practices to alleviate tennis elbow and shoulder pain.  - Conversation held about importance of avoiding activities that trigger pain. - Discussed modifications to avoid repetitive arm movements at work, especially movements above shoulder height. - Encouraged patient to keep his wrist in a neutral position as much as possible, and he will wear braces-carpal tunnel or cock up splint type braces to prevent flexion& extension..  - Reviewed that prudent use of ice may be used to alleviate pain & inflammaition.  Explained this can often be better than NSAIDs as well. - Advised patient regarding use of braces on affected areas.-Tennis elbow brace for his elbow and wrist braces for his wrist pains - Reviewed prudent use of medications such as NSAID's.  - Strongly recommended physical therapy.   - Ambulatory referral to PT placed today.  See orders. - Reviewed that patient may speak with Sports Medicine PRN about any of these concerns since he is already established with Dr. Micheline Chapman and had seen him in the recent past. - Discussed that Sports Medicine will have more options for treatment available to the patient-  i.e injections of the shoulder joint if need be.  - Will continue to monitor. - Extensive education provided and all questions answered.   Recommendations - Advised patient to schedule a CPE with fasting blood work in near  future as he was told in recent past. - Need for repeat colonoscopy given family Hx of father with bowel cancer.  Referral placed today -although reminded patient that these are items that are addressed during his yearly physical which I advised him to do shortly after last office visit  - Extensive discussion held with patient regarding policies and practices here at the clinic.  Answered all questions about care team and health management during appointment.  - Discussed need for patient to continue to obtain management and screenings with all established specialists.  Educated patient at length about the critical importance of keeping health maintenance up to date.  - Participated in lengthy conversation and all questions were answered.    Orders Placed This Encounter  Procedures  . Flu Vaccine QUAD 36+ mos IM  . Ambulatory referral to Gastroenterology  . Ambulatory referral to Physical Therapy    Meds ordered this encounter  Medications  . meloxicam (MOBIC) 7.5 MG tablet    Sig: Take 1-2 tablets (7.5-15 mg total) by mouth as needed for pain.    Dispense:  60 tablet    Refill:  0   Gross side effects, risk and benefits, and alternatives of medications and treatment plan in general discussed with patient.  Patient is aware that all medications have potential side effects and we are unable to predict every side effect or drug-drug interaction that may occur.   Patient will call with any questions prior to using medication if they have concerns.    Expresses verbal understanding  and consents to current therapy and treatment regimen.  No barriers to understanding were identified.  Red flag symptoms and signs discussed in detail.  Patient expressed understanding regarding what to do in case of emergency\urgent symptoms  Please see AVS handed out to patient at the end of our visit for further patient instructions/ counseling done pertaining to today's office visit.   Return for next  avaiable CPE/ yrly physical, come fasting for same day bldwrk.     Note:  This note was prepared with assistance of Dragon voice recognition software. Occasional wrong-word or sound-a-like substitutions may have occurred due to the inherent limitations of voice recognition software.   This document serves as a record of services personally performed by Mellody Dance, DO. It was created on her behalf by Toni Amend, a trained medical scribe. The creation of this record is based on the scribe's personal observations and the provider's statements to them.   I have reviewed the above medical documentation for accuracy and completeness and I concur.  Mellody Dance, DO 10/21/2018 3:05 PM   --------------------------------------------------------------------------------------------------------------------------------------------------------------------------------------------------------------------------------------------    Subjective:     HPI: Martin Hancock is a 43 y.o. male who presents to [place] at [facility] today for issues as discussed below.  Seeing Dr. Micheline Chapman of Sports Medicine.  Notes he was sent to Orthopedic surgeon because when they did the ultrasound, they "found some, but nothing to explain."  He was referred to surgeon who scheduled an MRI, but told to "wait" on that to see if his area of concern healed in the meantime.  In addition, states he was prescribed "two drugs that are pain relievers but also help with healing."  Notes "I was doing the gabapentin and two other ones."  Right Arm Pain, Shoulder, Elbow, Wrist Notes his main concern is that he's been having a lot of pain in his right shoulder, right elbow, and wrist.  "I bought one of those tennis elbow sleeves with gel packs on it, and that helped out a lot; I got the extra large because I did some reading, and thought let's give it a try."  Notes that the sleeve is very restrictive but has  provided some benefits.  At work, he is on the computer all day; travels a lot and carries a 45 lb suitcase wherever he goes.  Says trying to use his left arm more often but he feels pain in his right arm, even in bed.  Says his right arm "falls asleep very easily" if he lies on it at night.  This has been happening for about a year or more.  However, he denies pain that wakes him up in the middle of the night.  Onset:  Started hurting about two months ago.  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  Notes pain hurts more in the anterior aspect of his shoulder, hurts when he does anything overhead or above 90 degrees above shoulder height.  Played baseball, basketball, football, etc., in youth.  Denies history of injury.  Denies radiation down the arm.  Right Elbow  Pain in lateral aspect of his right elbow. Worse with: Picking things up.  Notes he has golfed in the past but "not in a while."  Colonoscopy Has had a colonoscopy 10 years prior.   Wt Readings from  Last 3 Encounters:  10/21/18 281 lb 1.6 oz (127.5 kg)  05/02/18 280 lb (127 kg)  11-May-2018 276 lb (125.2 kg)   BP Readings from Last 3 Encounters:  10/21/18 110/72  05/02/18 (!) 117/47  12/12/16 (!) 131/51   Pulse Readings from Last 3 Encounters:  10/21/18 97  12/12/16 90  04/11/12 86   BMI Readings from Last 3 Encounters:  10/21/18 38.12 kg/m  05/02/18 37.97 kg/m  05/11/18 37.43 kg/m     Patient Care Team    Relationship Specialty Notifications Start End  Mellody Dance, DO PCP - General Family Medicine  2018-05-11   Thurman Coyer, [place] Physician Sports Medicine  10/21/18      Patient Active Problem List   Diagnosis Date Noted  . Obesity (BMI 30-39.9) 11-May-2018    Priority: High  . Environmental and seasonal allergies 2018-05-11    Priority: Medium  .  History of smoking for 6-10 years 05/11/2018    Priority: Low  . Family history of diabetes mellitus (DM) 05/11/18    Priority: Low  . h/o GERD 08/21/2007    Priority: Low  . Chronic right shoulder pain 10/21/2018  . Right tennis elbow 10/21/2018  . h/o Carpal tunnel syndrome of right wrist 10/21/2018  . Impingement syndrome of right shoulder 10/21/2018  . Rotator cuff tendinitis, right 10/21/2018  . Overuse injury 10/21/2018  . Family history of uterine cancer- Mom 05/11/18  . Family history of breast cancer- aunt 05/11/2018  . Family history of colon cancer in father- in early 51's May 11, 2018  . Patient's father is deceased- pt. doesn't know his med hx other than died mid-60's.  05/11/18  . History of back surgery 05-11-2018  . STREP THROAT 01/13/2009  . CONTACT DERMATITIS&OTHER ECZEMA DUE TO PLANTS 03/30/2008  . EPIDEMIC HEMORRHAGIC CONJUNCTIVITIS 02/09/2008  . DIARRHEA 08/21/2007  . ABDOMINAL PAIN-RLQ 08/21/2007  . ABDOMINAL PAIN RIGHT UPPER QUADRANT 07/30/2007  . ROM 12/16/2006  . VIRAL URI 12/13/2006  . DIVERTICULOSIS, COLON 08/17/2006    Past Medical history, Surgical history, Family history, Social history, Allergies and Medications have been entered into the medical record, reviewed and changed as needed.    No outpatient medications have been marked as taking for the 10/21/18 encounter (Office Visit) with Mellody Dance, DO.    Allergies:  No Known Allergies   Review of Systems:  A fourteen system review of systems was performed and found to be positive as per HPI.   Objective:   Blood pressure 110/72, pulse 97, temperature 98.1 F (36.7 C), resp. rate 16, height 6' (1.829 m), weight 281 lb 1.6 oz (127.5 kg), SpO2 98 %. Body mass index is 38.12 kg/m. General:  Well Developed, well nourished, appropriate for stated age.  Neuro:  Alert and oriented,  extra-ocular muscles intact  HEENT:  Normocephalic, atraumatic, neck supple, no carotid bruits  appreciated  Skin:  no gross rash, warm, pink. Cardiac:  RRR, S1 S2 Respiratory:  ECTA B/L and A/P, Not using accessory muscles, speaking in full sentences- unlabored. Vascular:  Ext warm, no cyanosis apprec.; cap RF less 2 sec. Psych:  No HI/SI, judgement and insight good, Euthymic mood. Full Affect.  Right Shoulder:  Anterior shoulder pain with inflammation and pain upon palpation in region of the supraspinatus tendon insertion.   Minimal pain over AC joint or bicipital groove.    Minimal crepitus with extension and flexion however, full range of motion in all planes and strength good with resisted internal rotation, external rotation, abduction,  abduction as well as flexion extension of the shoulder.  Negative Neer and Hawkins sign.  Empty can test was equivocal.   Anterior shoulder on the right with slight bogginess/swelling and slight increased warmth vs the temperature of the Left shoulder/ R upper am. Right Elbow: Tender to palpation at the lateral epicondyle and pain with resistive extension at the wrist as well as pronation of the wrist.  All other exam was within normal limits.  Neurovascularly intact distally with full range of motion and strength of upper extremity

## 2018-11-26 ENCOUNTER — Ambulatory Visit: Payer: Self-pay | Admitting: Gastroenterology

## 2018-12-09 ENCOUNTER — Ambulatory Visit (INDEPENDENT_AMBULATORY_CARE_PROVIDER_SITE_OTHER): Payer: BC Managed Care – PPO | Admitting: Family Medicine

## 2018-12-09 ENCOUNTER — Encounter: Payer: Self-pay | Admitting: Family Medicine

## 2018-12-09 ENCOUNTER — Other Ambulatory Visit: Payer: BC Managed Care – PPO

## 2018-12-09 ENCOUNTER — Other Ambulatory Visit: Payer: Self-pay

## 2018-12-09 VITALS — BP 139/84 | HR 84 | Temp 97.6°F | Resp 12 | Ht 72.0 in | Wt 281.5 lb

## 2018-12-09 DIAGNOSIS — Z800 Family history of malignant neoplasm of digestive organs: Secondary | ICD-10-CM

## 2018-12-09 DIAGNOSIS — E669 Obesity, unspecified: Secondary | ICD-10-CM

## 2018-12-09 DIAGNOSIS — Z833 Family history of diabetes mellitus: Secondary | ICD-10-CM

## 2018-12-09 DIAGNOSIS — Z87891 Personal history of nicotine dependence: Secondary | ICD-10-CM

## 2018-12-09 DIAGNOSIS — Z719 Counseling, unspecified: Secondary | ICD-10-CM | POA: Diagnosis not present

## 2018-12-09 DIAGNOSIS — Z0000 Encounter for general adult medical examination without abnormal findings: Secondary | ICD-10-CM | POA: Diagnosis not present

## 2018-12-09 NOTE — Progress Notes (Signed)
Male physical  Impression and Recommendations:    1. Encounter for wellness examination   2. Health education/counseling   3. Obesity (BMI 30-39.9)   4. Family history of colon cancer in father- in early 27's   5. Family history of diabetes mellitus (DM)   6. History of smoking for 6-10 years      1) Anticipatory Guidance: Discussed importance of wearing a seatbelt while driving, not texting while driving;   sunscreen when outside along with skin surveillance; eating a balanced and modest diet; physical activity at least 25 minutes per day or 150 min/ week moderate to intense activity.  - Prudent self-testicular screening habits discussed with patient today.  - To help avoid need to urinate at night, encouraged patient to avoid drinking water prior to bedtime.  2) Immunizations / Screenings / Labs:   All immunizations are up-to-date per recommendations or will be updated today. Patient is due for dental and vision screens which pt will schedule independently. Will obtain CBC, CMP, HgA1c, Lipid panel, TSH and vit D when fasting, if not already done recently.   - Pt with family history of colon cancer in father. - Patient was recently referred to GI and will be going in near future.  3) Weight - Body mass index is 38.18 kg/m BMI meaning discussed with patient.  Discussed goal of losing 5-10% of current body weight which would improve overall feelings of well being and improve objective health data. Improve nutrient density of diet through increasing intake of fruits and vegetables and decreasing saturated fats, white flour products and refined sugars.   American Heart Association guidelines for healthy diet, basically Mediterranean diet, and exercise guidelines of 30 minutes 5 days per week or more discussed in detail.  - Handout provided today to assist patient with glycemic index.  Health counseling performed.  All questions answered.  - Told patient to utilize the  MyFitnessPal / LoseIt app and carefully track his daily nutritional intake.  4) Lifestyle & Preventative Health Maintenance - Advised patient to continue working toward exercising to improve overall mental, physical, and emotional health.    - Reviewed the "spokes of the wheel" of mood and health management.  Stressed the importance of ongoing prudent habits, including regular exercise, appropriate sleep hygiene, healthful dietary habits, and prayer/meditation to relax.  - Encouraged patient to engage in daily physical activity, especially a formal exercise routine.  Recommended that the patient eventually strive for at least 150 minutes of moderate cardiovascular activity per week according to guidelines established by the [facility].   - Healthy dietary habits encouraged, including low-carb, and high amounts of lean protein in diet.   - Patient should also consume adequate amounts of water.  - Novel Covid -19 counseling done; all questions were answered.   - Current CDC / federal and Delta guidelines reviewed with patient  - Reminded pt of extreme importance of social distancing; wearing a mask when out in public; insensate handwashing and cleaning of surfaces, avoiding unnecessary trips for shopping and avoiding ALL but emergency appts etc. - Told patient to be prepared, not scared; and be smart for the sake of others - Patient will call with any additional concerns    No orders of the defined types were placed in this encounter.   No orders of the defined types were placed in this encounter.    Return for f/up near future, DOXY, to review labs/ go over food log.    Martin Hancock  side effects, risk and benefits, and alternatives of medications discussed with patient.  Patient is aware that all medications have potential side effects and we are unable to predict every side effect or drug-drug interaction that may occur.  Expresses verbal understanding and consents to current therapy plan and  treatment regimen.  Please see AVS handed out to patient at the end of our visit for further patient instructions/ counseling done pertaining to today's office visit.  Follow-up preventative CPE in 1 year. Follow-up office visit - may be needed pending lab work.   - F/up sooner for chronic care management as above and/or prn  This document serves as a record of services personally performed by Mellody Dance, DO. It was created on her behalf by Toni Amend, a trained medical scribe. The creation of this record is based on the scribe's personal observations and the provider's statements to them.   This case required medical decision making of at least moderate complexity. The above documentation has been reviewed to be accurate and was completed by Marjory Sneddon, D.O.      Subjective:   I, Toni Amend, am serving as scribe for Dr. Mellody Dance.  CC: CPE  HPI: Martin Hancock is a 43 y.o. male who presents to [facility] [facility] today for a yearly health maintenance exam.     Health Maintenance Summary Reviewed and updated, unless pt declines services.  Colonoscopy:   Family history of colon cancer in father. Tobacco History Reviewed:  Former smoker, quit in 2011; 15 pack-years reported. Alcohol:    No concerns, no excessive use Exercise Habits:  Notes recently got a rowing machine, and has been exercising.  Says they have an elliptical as well and are "trying to get back into shape."  Says his biggest hurtle is his travelling.  Notes he does try to eat healthfully but is open to reviewing his dietary habits. STD concerns:   None Drug Use:   None Birth control method:  No concerns. Testicular/penile concerns:  No concerns  He continues to travel often for work, including lately to areas of high COVID risk.  Notes he wishes to be healthier on behalf of his family.  Last had his teeth cleaned at the dentist twelve months ago.  Last had  his eyes checked a year ago; he goes once yearly.  Denies skin changes or concerns; "not that I'm aware of."  Denies concerns with urination; states he does get up sometimes in the middle of the night, or 4-5 in the morning, in order to urinate.  This is his norm.  Feels it happens "routinely."   Immunization History  Administered Date(s) Administered  . Influenza,inj,Quad PF,6+ Mos 10/21/2018  . Influenza-Unspecified 09/02/2016    Health Maintenance  Topic Date Due  . TETANUS/TDAP  05/01/2019 (Originally 02/15/1994)  . HIV Screening  05/01/2019 (Originally 02/15/1988)  . INFLUENZA VACCINE  Completed      Wt Readings from Last 3 Encounters:  12/09/18 281 lb 8 oz (127.7 kg)  10/21/18 281 lb 1.6 oz (127.5 kg)  05/02/18 280 lb (127 kg)   BP Readings from Last 3 Encounters:  12/09/18 139/84  10/21/18 110/72  05/02/18 (!) 117/47   Pulse Readings from Last 3 Encounters:  12/09/18 84  10/21/18 97  12/12/16 90    Patient Active Problem List   Diagnosis Date Noted  . Obesity (BMI 30-39.9) 05/01/2018    Priority: High  . Environmental and seasonal allergies 05/01/2018  Priority: Medium  . Family history of colon cancer in father- in early 60's 05-22-18    Priority: Medium  . Patient's father is deceased- pt. doesn't know his med hx other than died mid-60's.  May 22, 2018    Priority: Medium  . History of smoking for 6-10 years 05-22-2018    Priority: Low  . Family history of diabetes mellitus (DM) 2018-05-22    Priority: Low  . h/o GERD 08/21/2007    Priority: Low  . Chronic right shoulder pain 10/21/2018  . Right tennis elbow 10/21/2018  . h/o Carpal tunnel syndrome of right wrist 10/21/2018  . Impingement syndrome of right shoulder 10/21/2018  . Rotator cuff tendinitis, right 10/21/2018  . Overuse injury 10/21/2018  . Family history of uterine cancer- Mom 05/22/18  . Family history of breast cancer- aunt 2018/05/22  . History of back surgery May 22, 2018  . STREP  THROAT 01/13/2009  . CONTACT DERMATITIS&OTHER ECZEMA DUE TO PLANTS 03/30/2008  . EPIDEMIC HEMORRHAGIC CONJUNCTIVITIS 02/09/2008  . DIARRHEA 08/21/2007  . ABDOMINAL PAIN-RLQ 08/21/2007  . ABDOMINAL PAIN RIGHT UPPER QUADRANT 07/30/2007  . ROM 12/16/2006  . VIRAL URI 12/13/2006  . DIVERTICULOSIS, COLON 08/17/2006    No past medical history on file.  Past Surgical History:  Procedure Laterality Date  . BACK SURGERY    . BACK SURGERY  2003  . TYMPANOSTOMY TUBE PLACEMENT      Family History  Problem Relation Age of Onset  . Colon cancer Father   . Liver cancer Father   . Diabetes Mother     Social History   Substance and Sexual Activity  Drug Use No  ,  Social History   Substance and Sexual Activity  Alcohol Use Yes  . Alcohol/week: 14.0 standard drinks  . Types: 14 Standard drinks or equivalent per week  ,  Social History   Tobacco Use  Smoking Status Former Smoker  . Packs/day: 1.00  . Years: 15.00  . Pack years: 15.00  . Quit date: 2011  . Years since quitting: 9.9  Smokeless Tobacco Never Used  ,  Social History   Substance and Sexual Activity  Sexual Activity Yes  . Birth control/protection: None    Patient's Medications  New Prescriptions   No medications on file  Previous Medications   GABAPENTIN (NEURONTIN) 300 MG CAPSULE    Take 1 capsule (300 mg total) by mouth at bedtime.   IBUPROFEN (ADVIL) 200 MG TABLET    Take 200 mg by mouth 4 (four) times daily.   MELOXICAM (MOBIC) 7.5 MG TABLET    Take 1-2 tablets (7.5-15 mg total) by mouth as needed for pain.   NAPROXEN (NAPROSYN) 500 MG TABLET    Take 1 tablet by mouth 2 (two) times daily.   TIZANIDINE (ZANAFLEX) 4 MG CAPSULE    Take 1 capsule by mouth 2 (two) times daily as needed.  Modified Medications   No medications on file  Discontinued Medications   No medications on file    Patient has no known allergies.  Review of Systems: General:   Denies fever, chills, unexplained weight loss.   Optho/Auditory:   Denies visual changes, blurred vision/LOV Respiratory:   Denies SOB, DOE more than baseline levels.   Cardiovascular:   Denies chest pain, palpitations, new onset peripheral edema  Gastrointestinal:   Denies nausea, vomiting, diarrhea.  Genitourinary: Denies dysuria, freq/ urgency, flank pain or discharge from genitals.  Endocrine:     Denies hot or cold intolerance, polyuria, polydipsia. Musculoskeletal:  Denies unexplained myalgias, joint swelling, unexplained arthralgias, gait problems.  Skin:  Denies rash, suspicious lesions Neurological:     Denies dizziness, unexplained weakness, numbness  Psychiatric/Behavioral:   Denies mood changes, suicidal or homicidal ideations, hallucinations    Objective:     Blood pressure 139/84, pulse 84, temperature 97.6 F (36.4 C), temperature source Oral, resp. rate 12, height 6' (1.829 m), weight 281 lb 8 oz (127.7 kg), SpO2 98 %. Body mass index is 38.18 kg/m. General Appearance:    Alert, cooperative, no distress, appears stated age  Head:    Normocephalic, without obvious abnormality, atraumatic  Eyes:    PERRL, conjunctiva/corneas clear, EOM's intact, fundi    benign, both eyes  Ears:    Normal TM's and external ear canals, both ears  Nose:   Nares normal, septum midline, mucosa normal, no drainage    or sinus tenderness  Throat:   Lips w/o lesion, mucosa moist, and tongue normal; teeth and   gums normal  Neck:   Supple, symmetrical, trachea midline, no adenopathy;    thyroid:  no enlargement/tenderness/nodules; no carotid   bruit or JVD  Back:     Symmetric, no curvature, ROM normal, no CVA tenderness  Lungs:     Clear to auscultation bilaterally, respirations unlabored, no       Wh/ R/ R  Chest Wall:    No tenderness or gross deformity; normal excursion   Heart:    Regular rate and rhythm, S1 and S2 normal, no murmur, rub   or gallop  Abdomen:     Soft, non-tender, bowel sounds active all four quadrants, NO   G/R/R,  no masses, no organomegaly  Genitalia:    Ext genitalia: without lesion, no penile rash or discharge, no hernias appreciated   Rectal:    Normal tone, prostate WNL's and equal b/l, no tenderness; guaiac negative stool  Extremities:   Extremities normal, atraumatic, no cyanosis or gross edema  Pulses:   2+ and symmetric all extremities  Skin:   Warm, dry, Skin color, texture, turgor normal, no obvious rashes or lesions  M-Sk:   Ambulates * 4 w/o difficulty, no gross deformities, tone WNL  Neurologic:   CNII-XII intact, normal strength, sensation and reflexes    Throughout Psych:  No HI/SI, judgement and insight good, Euthymic mood. Full Affect.

## 2018-12-09 NOTE — Patient Instructions (Addendum)
GOALS:  Download MyFitnessPal / LoseIt app Track all nutritional intake  Note what percent of fat, protein, and carbs you are consuming  Try to make protein the biggest proportion of your diet      Preventive Care, Male Preventive care refers to lifestyle choices and visits with your health care provider that can promote health and wellness. What does preventive care include?   A yearly physical exam. This is also called an annual well check.  Dental exams once or twice a year.  Routine eye exams. Ask your health care provider how often you should have your eyes checked.  Personal lifestyle choices, including: ? Daily care of your teeth and gums. ? Regular physical activity. ? Eating a healthy diet. ? Avoiding tobacco and drug use. ? Limiting alcohol use. ? Practicing safe sex. ? Taking low doses of aspirin every day. ? Taking vitamin and mineral supplements as recommended by your health care provider. What happens during an annual well check? The services and screenings done by your health care provider during your annual well check will depend on your age, overall health, lifestyle risk factors, and family history of disease. Counseling Your health care provider may ask you questions about your:  Alcohol use.  Tobacco use.  Drug use.  Emotional well-being.  Home and relationship well-being.  Sexual activity.  Eating habits.  History of falls.  Memory and ability to understand (cognition).  Work and work Statistician. Screening You may have the following tests or measurements:  Height, weight, and BMI.  Blood pressure.  Lipid and cholesterol levels. These may be checked every 5 years, or more frequently if you are over 28 years old.  Skin check.  Lung cancer screening. You may have this screening every year starting at age 72 if you have a 30-pack-year history of smoking and currently smoke or have quit within the past 15 years.  Colorectal cancer  screening. All adults should have this screening starting at age 64 and continuing until age 57. You will have tests every 1-10 years, depending on your results and the type of screening test. People at increased risk should start screening at an earlier age. Screening tests may include: ? Guaiac-based fecal occult blood testing. ? Fecal immunochemical test (FIT). ? Stool DNA test. ? Virtual colonoscopy. ? Sigmoidoscopy. During this test, a flexible tube with a tiny camera (sigmoidoscope) is used to examine your rectum and lower colon. The sigmoidoscope is inserted through your anus into your rectum and lower colon. ? Colonoscopy. During this test, a long, thin, flexible tube with a tiny camera (colonoscope) is used to examine your entire colon and rectum.  Prostate cancer screening. Recommendations will vary depending on your family history and other risks.  Hepatitis C blood test.  Hepatitis B blood test.  Sexually transmitted disease (STD) testing.  Diabetes screening. This is done by checking your blood sugar (glucose) after you have not eaten for a while (fasting). You may have this done every 1-3 years.  Abdominal aortic aneurysm (AAA) screening. You may need this if you are a current or former smoker.  Osteoporosis. You may be screened starting at age 50 if you are at high risk. Talk with your health care provider about your test results, treatment options, and if necessary, the need for more tests. Vaccines Your health care provider may recommend certain vaccines, such as:  Influenza vaccine. This is recommended every year.  Tetanus, diphtheria, and acellular pertussis (Tdap, Td) vaccine. You may need a  Td booster every 10 years.  Varicella vaccine. You may need this if you have not been vaccinated.  Zoster vaccine. You may need this after age 44.  Measles, mumps, and rubella (MMR) vaccine. You may need at least one dose of MMR if you were born in 1957 or later. You may also  need a second dose.  Pneumococcal 13-valent conjugate (PCV13) vaccine. One dose is recommended after age 61.  Pneumococcal polysaccharide (PPSV23) vaccine. One dose is recommended after age 62.  Meningococcal vaccine. You may need this if you have certain conditions.  Hepatitis A vaccine. You may need this if you have certain conditions or if you travel or work in places where you may be exposed to hepatitis A.  Hepatitis B vaccine. You may need this if you have certain conditions or if you travel or work in places where you may be exposed to hepatitis B.  Haemophilus influenzae type b (Hib) vaccine. You may need this if you have certain risk factors. Talk to your health care provider about which screenings and vaccines you need and how often you need them. This information is not intended to replace advice given to you by your health care provider. Make sure you discuss any questions you have with your health care provider. Document Released: 01/15/2015 Document Revised: 02/08/2017 Document Reviewed: 10/20/2014 Elsevier Interactive Patient Education  2019 [place] for Adults, Male A healthy lifestyle and preventive care can promote health and wellness. Preventive health guidelines for men include the following key practices:  A routine yearly physical is a good way to check with your health care provider about your health and preventative screening. It is a chance to share any concerns and updates on your health and to receive a thorough exam.  Visit your dentist for a routine exam and preventative care every 6 months. Brush your teeth twice a day and floss once a day. Good oral hygiene prevents tooth decay and gum disease.  The frequency of eye exams is based on your age, health, family medical history, use of contact lenses, and other factors. Follow your health care provider's recommendations for frequency of eye exams.  Eat a healthy diet. Foods such  as vegetables, fruits, whole grains, low-fat dairy products, and lean protein foods contain the nutrients you need without too many calories. Decrease your intake of foods high in solid fats, added sugars, and salt. Eat the right amount of calories for you. Get information about a proper diet from your health care provider, if necessary.  Regular physical exercise is one of the most important things you can do for your health. Most adults should get at least 150 minutes of moderate-intensity exercise (any activity that increases your heart rate and causes you to sweat) each week. In addition, most adults need muscle-strengthening exercises on 2 or more days a week.  Maintain a healthy weight. The body mass index (BMI) is a screening tool to identify possible weight problems. It provides an estimate of body fat based on height and weight. Your health care provider can find your BMI and can help you achieve or maintain a healthy weight. For adults 20 years and older:  A BMI below 18.5 is considered underweight.  A BMI of 18.5 to 24.9 is normal.  A BMI of 25 to 29.9 is considered overweight.  A BMI of 30 and above is considered obese.  Maintain normal blood lipids and cholesterol levels by exercising and  minimizing your intake of saturated fat. Eat a balanced diet with plenty of fruit and vegetables. Blood tests for lipids and cholesterol should begin at age 24 and be repeated every 5 years. If your lipid or cholesterol levels are high, you are over 50, or you are at high risk for heart disease, you may need your cholesterol levels checked more frequently. Ongoing high lipid and cholesterol levels should be treated with medicines if diet and exercise are not working.  If you smoke, find out from your health care provider how to quit. If you do not use tobacco, do not start.  Lung cancer screening is recommended for adults aged 31-80 years who are at high risk for developing lung cancer because of a  history of smoking. A yearly low-dose CT scan of the lungs is recommended for people who have at least a 30-pack-year history of smoking and are a current smoker or have quit within the past 15 years. A pack year of smoking is smoking an average of 1 pack of cigarettes a day for 1 year (for example: 1 pack a day for 30 years or 2 packs a day for 15 years). Yearly screening should continue until the smoker has stopped smoking for at least 15 years. Yearly screening should be stopped for people who develop a health problem that would prevent them from having lung cancer treatment.  If you choose to drink alcohol, do not have more than 2 drinks per day. One drink is considered to be 12 ounces (355 mL) of beer, 5 ounces (148 mL) of wine, or 1.5 ounces (44 mL) of liquor.  Avoid use of street drugs. Do not share needles with anyone. Ask for help if you need support or instructions about stopping the use of drugs.  High blood pressure causes heart disease and increases the risk of stroke. Your blood pressure should be checked at least every 1-2 years. Ongoing high blood pressure should be treated with medicines, if weight loss and exercise are not effective.  If you are 40-57 years old, ask your health care provider if you should take aspirin to prevent heart disease.  Diabetes screening is done by taking a blood sample to check your blood glucose level after you have not eaten for a certain period of time (fasting). If you are not overweight and you do not have risk factors for diabetes, you should be screened once every 3 years starting at age 5. If you are overweight or obese and you are 81-10 years of age, you should be screened for diabetes every year as part of your cardiovascular risk assessment.  Colorectal cancer can be detected and often prevented. Most routine colorectal cancer screening begins at the age of 46 and continues through age 8. However, your health care provider may recommend screening  at an earlier age if you have risk factors for colon cancer. On a yearly basis, your health care provider may provide home test kits to check for hidden blood in the stool. Use of a small camera at the end of a tube to directly examine the colon (sigmoidoscopy or colonoscopy) can detect the earliest forms of colorectal cancer. Talk to your health care provider about this at age 21, when routine screening begins. Direct exam of the colon should be repeated every 5-10 years through age 72, unless early forms of precancerous polyps or small growths are found.  People who are at an increased risk for hepatitis B should be screened for this virus.  You are considered at high risk for hepatitis B if:  You were born in a country where hepatitis B occurs often. Talk with your health care provider about which countries are considered high risk.  Your parents were born in a high-risk country and you have not received a shot to protect against hepatitis B (hepatitis B vaccine).  You have HIV or AIDS.  You use needles to inject street drugs.  You live with, or have sex with, someone who has hepatitis B.  You are a man who has sex with other men (MSM).  You get hemodialysis treatment.  You take certain medicines for conditions such as cancer, organ transplantation, and autoimmune conditions.  Hepatitis C blood testing is recommended for all people born from [birth year] through 1965 and any individual with known risks for hepatitis C.  Practice safe sex. Use condoms and avoid high-risk sexual practices to reduce the spread of sexually transmitted infections (STIs). STIs include gonorrhea, chlamydia, syphilis, trichomonas, herpes, HPV, and human immunodeficiency virus (HIV). Herpes, HIV, and HPV are viral illnesses that have no cure. They can result in disability, cancer, and death.  If you are a man who has sex with other men, you should be screened at least once per year for:  HIV.  Urethral, rectal, and  pharyngeal infection of gonorrhea, chlamydia, or both.  If you are at risk of being infected with HIV, it is recommended that you take a prescription medicine daily to prevent HIV infection. This is called preexposure prophylaxis (PrEP). You are considered at risk if:  You are a man who has sex with other men (MSM) and have other risk factors.  You are a heterosexual man, are sexually active, and are at increased risk for HIV infection.  You take drugs by injection.  You are sexually active with a partner who has HIV.  Talk with your health care provider about whether you are at high risk of being infected with HIV. If you choose to begin PrEP, you should first be tested for HIV. You should then be tested every 3 months for as long as you are taking PrEP.  A one-time screening for abdominal aortic aneurysm (AAA) and surgical repair of large AAAs by ultrasound are recommended for men ages 26 to 93 years who are current or former smokers.  Healthy men should no longer receive prostate-specific antigen (PSA) blood tests as part of routine cancer screening. Talk with your health care provider about prostate cancer screening.  Testicular cancer screening is not recommended for adult males who have no symptoms. Screening includes self-exam, a health care provider exam, and other screening tests. Consult with your health care provider about any symptoms you have or any concerns you have about testicular cancer.  Use sunscreen. Apply sunscreen liberally and repeatedly throughout the day. You should seek shade when your shadow is shorter than you. Protect yourself by wearing long sleeves, pants, a wide-brimmed hat, and sunglasses year round, whenever you are outdoors.  Once a month, do a whole-body skin exam, using a mirror to look at the skin on your back. Tell your health care provider about new moles, moles that have irregular borders, moles that are larger than a pencil eraser, or moles that have  changed in shape or color.  Stay current with required vaccines (immunizations).  Influenza vaccine. All adults should be immunized every year.  Tetanus, diphtheria, and acellular pertussis (Td, Tdap) vaccine. An adult who has not previously received Tdap or who does not  know his vaccine status should receive 1 dose of Tdap. This initial dose should be followed by tetanus and diphtheria toxoids (Td) booster doses every 10 years. Adults with an unknown or incomplete history of completing a 3-dose immunization series with Td-containing vaccines should begin or complete a primary immunization series including a Tdap dose. Adults should receive a Td booster every 10 years.  Varicella vaccine. An adult without evidence of immunity to varicella should receive 2 doses or a second dose if he has previously received 1 dose.  Human papillomavirus (HPV) vaccine. Males aged 11-21 years who have not received the vaccine previously should receive the 3-dose series. Males aged 22-26 years may be immunized. Immunization is recommended through the age of 80 years for any male who has sex with males and did not get any or all doses earlier. Immunization is recommended for any person with an immunocompromised condition through the age of 41 years if he did not get any or all doses earlier. During the 3-dose series, the second dose should be obtained 4-8 weeks after the first dose. The third dose should be obtained 24 weeks after the first dose and 16 weeks after the second dose.  Zoster vaccine. One dose is recommended for adults aged 84 years or older unless certain conditions are present.  Measles, mumps, and rubella (MMR) vaccine. Adults born before [birth year] generally are considered immune to measles and mumps. Adults born in [birth year] or later should have 1 or more doses of MMR vaccine unless there is a contraindication to the vaccine or there is laboratory evidence of immunity to each of the three diseases. A routine second  dose of MMR vaccine should be obtained at least 28 days after the first dose for students attending postsecondary schools, health care workers, or international travelers. People who received inactivated measles vaccine or an unknown type of measles vaccine during 1963-1967 should receive 2 doses of MMR vaccine. People who received inactivated mumps vaccine or an unknown type of mumps vaccine before 1979 and are at high risk for mumps infection should consider immunization with 2 doses of MMR vaccine. Unvaccinated health care workers born before 60 who lack laboratory evidence of measles, mumps, or rubella immunity or laboratory confirmation of disease should consider measles and mumps immunization with 2 doses of MMR vaccine or rubella immunization with 1 dose of MMR vaccine.  Pneumococcal 13-valent conjugate (PCV13) vaccine. When indicated, a person who is uncertain of his immunization history and has no record of immunization should receive the PCV13 vaccine. All adults 66 years of age and older should receive this vaccine. An adult aged 68 years or older who has certain medical conditions and has not been previously immunized should receive 1 dose of PCV13 vaccine. This PCV13 should be followed with a dose of pneumococcal polysaccharide (PPSV23) vaccine. Adults who are at high risk for pneumococcal disease should obtain the PPSV23 vaccine at least 8 weeks after the dose of PCV13 vaccine. Adults older than 45 years of age who have normal immune system function should obtain the PPSV23 vaccine dose at least 1 year after the dose of PCV13 vaccine.  Pneumococcal polysaccharide (PPSV23) vaccine. When PCV13 is also indicated, PCV13 should be obtained first. All adults aged 54 years and older should be immunized. An adult younger than age 23 years who has certain medical conditions should be immunized. Any person who resides in a nursing home or long-term care facility should be immunized. An adult smoker should  be immunized. People  with an immunocompromised condition and certain other conditions should receive both PCV13 and PPSV23 vaccines. People with human immunodeficiency virus (HIV) infection should be immunized as soon as possible after diagnosis. Immunization during chemotherapy or radiation therapy should be avoided. Routine use of PPSV23 vaccine is not recommended for American Indians, [place]atives, or people younger than 65 years unless there are medical conditions that require PPSV23 vaccine. When indicated, people who have unknown immunization and have no record of immunization should receive PPSV23 vaccine. One-time revaccination 5 years after the first dose of PPSV23 is recommended for people aged 19-64 years who have chronic kidney failure, nephrotic syndrome, asplenia, or immunocompromised conditions. People who received 1-2 doses of PPSV23 before age 75 years should receive another dose of PPSV23 vaccine at age 76 years or later if at least 5 years have passed since the previous dose. Doses of PPSV23 are not needed for people immunized with PPSV23 at or after age 11 years.  Meningococcal vaccine. Adults with asplenia or persistent complement component deficiencies should receive 2 doses of quadrivalent meningococcal conjugate (MenACWY-D) vaccine. The doses should be obtained at least 2 months apart. Microbiologists working with certain meningococcal bacteria, [place] recruits, people at risk during an outbreak, and people who travel to or live in countries with a high rate of meningitis should be immunized. A first-year college student up through age 39 years who is living in a residence hall should receive a dose if he did not receive a dose on or after his 16th birthday. Adults who have certain high-risk conditions should receive one or more doses of vaccine.  Hepatitis A vaccine. Adults who wish to be protected from this disease, have chronic liver disease, work with hepatitis A-infected animals,  work in hepatitis A research labs, or travel to or work in countries with a high rate of hepatitis A should be immunized. Adults who were previously unvaccinated and who anticipate close contact with an international adoptee during the first 60 days after arrival in the [place] from a country with a high rate of hepatitis A should be immunized.  Hepatitis B vaccine. Adults should be immunized if they wish to be protected from this disease, are under age 2 years and have diabetes, have chronic liver disease, have had more than one sex partner in the past 6 months, may be exposed to blood or other infectious body fluids, are household contacts or sex partners of hepatitis B positive people, are clients or workers in certain care facilities, or travel to or work in countries with a high rate of hepatitis B.  Haemophilus influenzae type b (Hib) vaccine. A previously unvaccinated person with asplenia or sickle cell disease or having a scheduled splenectomy should receive 1 dose of Hib vaccine. Regardless of previous immunization, a recipient of a hematopoietic stem cell transplant should receive a 3-dose series 6-12 months after his successful transplant. Hib vaccine is not recommended for adults with HIV infection. Preventive Service / Frequency Ages 31 to 44  Blood pressure check.** / Every 3-5 years.  Lipid and cholesterol check.** / Every 5 years beginning at age 60.  Hepatitis C blood test.** / For any individual with known risks for hepatitis C.  Skin self-exam. / Monthly.  Influenza vaccine. / Every year.  Tetanus, diphtheria, and acellular pertussis (Tdap, Td) vaccine.** / Consult your health care provider. 1 dose of Td every 10 years.  Varicella vaccine.** / Consult your health care provider.  HPV vaccine. / 3 doses over 6 months,  if 26 or younger.  Measles, mumps, rubella (MMR) vaccine.** / You need at least 1 dose of MMR if you were born in 1957 or later. You may also need a  second dose.  Pneumococcal 13-valent conjugate (PCV13) vaccine.** / Consult your health care provider.  Pneumococcal polysaccharide (PPSV23) vaccine.** / 1 to 2 doses if you smoke cigarettes or if you have certain conditions.  Meningococcal vaccine.** / 1 dose if you are age 59 to 44 years and a Market researcher living in a residence hall, or have one of several medical conditions. You may also need additional booster doses.  Hepatitis A vaccine.** / Consult your health care provider.  Hepatitis B vaccine.** / Consult your health care provider.  Haemophilus influenzae type b (Hib) vaccine.** / Consult your health care provider. Ages 50 to 55  Blood pressure check.** / Every year.  Lipid and cholesterol check.** / Every 5 years beginning at age 46.  Lung cancer screening. / Every year if you are aged 59-80 years and have a 30-pack-year history of smoking and currently smoke or have quit within the past 15 years. Yearly screening is stopped once you have quit smoking for at least 15 years or develop a health problem that would prevent you from having lung cancer treatment.  Fecal occult blood test (FOBT) of stool. / Every year beginning at age 8 and continuing until age 26. You may not have to do this test if you get a colonoscopy every 10 years.  Flexible sigmoidoscopy** or colonoscopy.** / Every 5 years for a flexible sigmoidoscopy or every 10 years for a colonoscopy beginning at age 81 and continuing until age 28.  Hepatitis C blood test.** / For all people born from [birth year] through 1965 and any individual with known risks for hepatitis C.  Skin self-exam. / Monthly.  Influenza vaccine. / Every year.  Tetanus, diphtheria, and acellular pertussis (Tdap/Td) vaccine.** / Consult your health care provider. 1 dose of Td every 10 years.  Varicella vaccine.** / Consult your health care provider.  Zoster vaccine.** / 1 dose for adults aged 78 years or older.  Measles, mumps,  rubella (MMR) vaccine.** / You need at least 1 dose of MMR if you were born in 1957 or later. You may also need a second dose.  Pneumococcal 13-valent conjugate (PCV13) vaccine.** / Consult your health care provider.  Pneumococcal polysaccharide (PPSV23) vaccine.** / 1 to 2 doses if you smoke cigarettes or if you have certain conditions.  Meningococcal vaccine.** / Consult your health care provider.  Hepatitis A vaccine.** / Consult your health care provider.  Hepatitis B vaccine.** / Consult your health care provider.  Haemophilus influenzae type b (Hib) vaccine.** / Consult your health care provider. Ages 47 and over  Blood pressure check.** / Every year.  Lipid and cholesterol check.**/ Every 5 years beginning at age 42.  Lung cancer screening. / Every year if you are aged 60-80 years and have a 30-pack-year history of smoking and currently smoke or have quit within the past 15 years. Yearly screening is stopped once you have quit smoking for at least 15 years or develop a health problem that would prevent you from having lung cancer treatment.  Fecal occult blood test (FOBT) of stool. / Every year beginning at age 40 and continuing until age 78. You may not have to do this test if you get a colonoscopy every 10 years.  Flexible sigmoidoscopy** or colonoscopy.** / Every 5 years for a flexible sigmoidoscopy or  every 10 years for a colonoscopy beginning at age 89 and continuing until age 4.  Hepatitis C blood test.** / For all people born from [birth year] through 1967 and any individual with known risks for hepatitis C.  Abdominal aortic aneurysm (AAA) screening.** / A one-time screening for ages 30 to 49 years who are current or former smokers.  Skin self-exam. / Monthly.  Influenza vaccine. / Every year.  Tetanus, diphtheria, and acellular pertussis (Tdap/Td) vaccine.** / 1 dose of Td every 10 years.  Varicella vaccine.** / Consult your health care provider.  Zoster vaccine.** / 1  dose for adults aged 40 years or older.  Pneumococcal 13-valent conjugate (PCV13) vaccine.** / 1 dose for all adults aged 27 years and older.  Pneumococcal polysaccharide (PPSV23) vaccine.** / 1 dose for all adults aged 15 years and older.  Meningococcal vaccine.** / Consult your health care provider.  Hepatitis A vaccine.** / Consult your health care provider.  Hepatitis B vaccine.** / Consult your health care provider.  Haemophilus influenzae type b (Hib) vaccine.** / Consult your health care provider. **Family history and personal history of risk and conditions may change your health care provider's recommendations.   This information is not intended to replace advice given to you by your health care provider. Make sure you discuss any questions you have with your health care provider.   Document Released: 02/14/2001 Document Revised: 01/09/2014 Document Reviewed: 05/16/2010 Elsevier Interactive Patient Education 2016 [place] factors for prediabetes and type 2 diabetes  Researchers don't fully understand why some people develop prediabetes and type 2 diabetes and others don't.  It's clear that certain factors increase the risk, however, including:  Weight. The more fatty tissue you have, the more resistant your cells become to insulin.  Inactivity. The less active you are, the greater your risk. Physical activity helps you control your weight, uses up glucose as energy and makes your cells more sensitive to insulin.  Family history. Your risk increases if a parent or sibling has type 2 diabetes.  Race. Although it's unclear why, people of certain races -- including blacks, Hispanics, American Indians and Asian-Americans -- are at higher risk.  Age. Your risk increases as you get older. This may be because you tend to exercise less, lose muscle mass and gain weight as you age. But type 2 diabetes is also increasing dramatically among children, adolescents and  younger adults.  Gestational diabetes. If you developed gestational diabetes when you were pregnant, your risk of developing prediabetes and type 2 diabetes later increases. If you gave birth to a baby weighing more than 9 pounds (4 kilograms), you're also at risk of type 2 diabetes.  Polycystic ovary syndrome. For women, having polycystic ovary syndrome -- a common condition characterized by irregular menstrual periods, excess hair growth and obesity -- increases the risk of diabetes.  High blood pressure. Having blood pressure over 140/90 millimeters of mercury (mm Hg) is linked to an increased risk of type 2 diabetes.  Abnormal cholesterol and triglyceride levels. If you have low levels of high-density lipoprotein (HDL), or "good," cholesterol, your risk of type 2 diabetes is higher. Triglycerides are another type of fat carried in the blood. People with high levels of triglycerides have an increased risk of type 2 diabetes. Your doctor can let you know what your cholesterol and triglyceride levels are.  A good guide to good carbs: The glycemic index ---If you have diabetes, or at risk for diabetes, you  know all too well that when you eat carbohydrates, your blood sugar goes up. The total amount of carbs you consume at a meal or in a snack mostly determines what your blood sugar will do. But the food itself also plays a role. A serving of white rice has almost the same effect as eating pure table sugar -- a quick, high spike in blood sugar. A serving of lentils has a slower, smaller effect.  ---Picking good sources of carbs can help you control your blood sugar and your weight. Even if you don't have diabetes, eating healthier carbohydrate-rich foods can help ward off a host of chronic conditions, from heart disease to various cancers to, well, diabetes.  ---One way to choose foods is with the glycemic index (GI). This tool measures how much a food boosts blood sugar.  The glycemic index rates the  effect of a specific amount of a food on blood sugar compared with the same amount of pure glucose. A food with a glycemic index of 28 boosts blood sugar only 28% as much as pure glucose. One with a GI of 95 acts like pure glucose.    High glycemic foods result in a quick spike in insulin and blood sugar (also known as blood glucose).  Low glycemic foods have a slower, smaller effect- these are healthier for you.   Using the glycemic index Using the glycemic index is easy: choose foods in the low GI category instead of those in the high GI category (see below), and go easy on those in between. Low glycemic index (GI of 55 or less): Most fruits and vegetables, beans, minimally processed grains, pasta, low-fat dairy foods, and nuts.  Moderate glycemic index (GI 56 to 69): White and sweet potatoes, corn, white rice, couscous, breakfast cereals such as Cream of Wheat and Mini Wheats.  High glycemic index (GI of 70 or higher): White bread, rice cakes, most crackers, bagels, cakes, doughnuts, croissants, most packaged breakfast cereals. You can see the values for 100 commons foods and get links to more at www.health.CheapToothpicks.si.  Swaps for lowering glycemic index  Instead of this high-glycemic index food Eat this lower-glycemic index food  White rice Brown rice or converted rice  Instant oatmeal Steel-cut oats  Cornflakes Bran flakes  Baked potato Pasta, bulgur  White bread Whole-grain bread  Corn Peas or leafy greens       Prediabetes Eating Plan  Prediabetes--also called impaired glucose tolerance or impaired fasting glucose--is a condition that causes blood sugar (blood glucose) levels to be higher than normal. Following a healthy diet can help to keep prediabetes under control. It can also help to lower the risk of type 2 diabetes and heart disease, which are increased in people who have prediabetes. Along with regular exercise, a healthy diet:  Promotes weight loss.  Helps to  control blood sugar levels.  Helps to improve the way that the body uses insulin.   WHAT DO I NEED TO KNOW ABOUT THIS EATING PLAN?   Use the glycemic index (GI) to plan your meals. The index tells you how quickly a food will raise your blood sugar. Choose low-GI foods. These foods take a longer time to raise blood sugar.  Pay close attention to the amount of carbohydrates in the food that you eat. Carbohydrates increase blood sugar levels.  Keep track of how many calories you take in. Eating the right amount of calories will help you to achieve a healthy weight. Losing about 7 percent of  your starting weight can help to prevent type 2 diabetes.  You may want to follow a Mediterranean diet. This diet includes a lot of vegetables, lean meats or fish, whole grains, fruits, and healthy oils and fats.   WHAT FOODS CAN I EAT?  Grains Whole grains, such as whole-wheat or whole-grain breads, crackers, cereals, and pasta. Unsweetened oatmeal. Bulgur. Barley. Quinoa. Brown rice. Corn or whole-wheat flour tortillas or taco shells. Vegetables Lettuce. Spinach. Peas. Beets. Cauliflower. Cabbage. Broccoli. Carrots. Tomatoes. Squash. Eggplant. Herbs. Peppers. Onions. Cucumbers. Brussels sprouts. Fruits Berries. Bananas. Apples. Oranges. Grapes. Papaya. Mango. Pomegranate. Kiwi. Grapefruit. Cherries. Meats and Other Protein Sources Seafood. Lean meats, such as chicken and [place] or lean cuts of pork and beef. Tofu. Eggs. Nuts. Beans. Dairy Low-fat or fat-free dairy products, such as yogurt, cottage cheese, and cheese. Beverages Water. Tea. Coffee. Sugar-free or diet soda. Seltzer water. Milk. Milk alternatives, such as soy or almond milk. Condiments Mustard. Relish. Low-fat, low-sugar ketchup. Low-fat, low-sugar barbecue sauce. Low-fat or fat-free mayonnaise. Sweets and Desserts Sugar-free or low-fat pudding. Sugar-free or low-fat ice cream and other frozen treats. Fats and Oils Avocado. Walnuts.  Olive oil. The items listed above may not be a complete list of recommended foods or beverages. Contact your dietitian for more options.    WHAT FOODS ARE NOT RECOMMENDED?  Grains Refined white flour and flour products, such as bread, pasta, snack foods, and cereals. Beverages Sweetened drinks, such as sweet iced tea and soda. Sweets and Desserts Baked goods, such as cake, cupcakes, pastries, cookies, and cheesecake. The items listed above may not be a complete list of foods and beverages to avoid. Contact your dietitian for more information.   This information is not intended to replace advice given to you by your health care provider. Make sure you discuss any questions you have with your health care provider.   Document Released: 05/05/2014 Document Reviewed: 05/05/2014 Elsevier Interactive Patient Education [employer].

## 2018-12-10 ENCOUNTER — Other Ambulatory Visit: Payer: Self-pay | Admitting: Family Medicine

## 2018-12-10 LAB — CBC WITH DIFFERENTIAL/PLATELET
Basophils Absolute: 0.1 10*3/uL (ref 0.0–0.2)
Basos: 1 %
EOS (ABSOLUTE): 0.2 10*3/uL (ref 0.0–0.4)
Eos: 4 %
Hematocrit: 45.7 % (ref 37.5–51.0)
Hemoglobin: 15.7 g/dL (ref 13.0–17.7)
Immature Grans (Abs): 0 10*3/uL (ref 0.0–0.1)
Immature Granulocytes: 0 %
Lymphocytes Absolute: 1.3 10*3/uL (ref 0.7–3.1)
Lymphs: 21 %
MCH: 31.5 pg (ref 26.6–33.0)
MCHC: 34.4 g/dL (ref 31.5–35.7)
MCV: 92 fL (ref 79–97)
Monocytes Absolute: 0.6 10*3/uL (ref 0.1–0.9)
Monocytes: 9 %
Neutrophils Absolute: 4.1 10*3/uL (ref 1.4–7.0)
Neutrophils: 65 %
Platelets: 226 10*3/uL (ref 150–450)
RBC: 4.98 x10E6/uL (ref 4.14–5.80)
RDW: 11.8 % (ref 11.6–15.4)
WBC: 6.3 10*3/uL (ref 3.4–10.8)

## 2018-12-10 LAB — T4, FREE: Free T4: 1.39 ng/dL (ref 0.82–1.77)

## 2018-12-10 LAB — COMPREHENSIVE METABOLIC PANEL
ALT: 20 IU/L (ref 0–44)
AST: 15 IU/L (ref 0–40)
Albumin/Globulin Ratio: 1.6 (ref 1.2–2.2)
Albumin: 4.4 g/dL (ref 4.0–5.0)
Alkaline Phosphatase: 105 IU/L (ref 39–117)
BUN/Creatinine Ratio: 19 (ref 9–20)
BUN: 16 mg/dL (ref 6–24)
Bilirubin Total: 0.7 mg/dL (ref 0.0–1.2)
CO2: 24 mmol/L (ref 20–29)
Calcium: 9.2 mg/dL (ref 8.7–10.2)
Chloride: 102 mmol/L (ref 96–106)
Creatinine, Ser: 0.83 mg/dL (ref 0.76–1.27)
GFR calc Af Amer: 123 mL/min/{1.73_m2} (ref >59)
GFR calc non Af Amer: 106 mL/min/{1.73_m2} (ref >59)
Globulin, Total: 2.7 g/dL (ref 1.5–4.5)
Glucose: 103 mg/dL — ABNORMAL HIGH (ref 65–99)
Potassium: 4.4 mmol/L (ref 3.5–5.2)
Sodium: 140 mmol/L (ref 134–144)
Total Protein: 7.1 g/dL (ref 6.0–8.5)

## 2018-12-10 LAB — LIPID PANEL
Chol/HDL Ratio: 3 ratio (ref 0.0–5.0)
Cholesterol, Total: 160 mg/dL (ref 100–199)
HDL: 53 mg/dL (ref >39)
LDL Chol Calc (NIH): 93 mg/dL (ref 0–99)
Triglycerides: 74 mg/dL (ref 0–149)
VLDL Cholesterol Cal: 14 mg/dL (ref 5–40)

## 2018-12-10 LAB — T3: T3, Total: 115 ng/dL (ref 71–180)

## 2018-12-10 LAB — VITAMIN D 25 HYDROXY (VIT D DEFICIENCY, FRACTURES): Vit D, 25-Hydroxy: 12 ng/mL — ABNORMAL LOW (ref 30.0–100.0)

## 2018-12-10 LAB — HEMOGLOBIN A1C
Est. average glucose Bld gHb Est-mCnc: 103 mg/dL
Hgb A1c MFr Bld: 5.2 % (ref 4.8–5.6)

## 2018-12-10 LAB — TSH: TSH: 1.56 u[IU]/mL (ref 0.450–4.500)

## 2018-12-10 MED ORDER — VITAMIN D (ERGOCALCIFEROL) 1.25 MG (50000 UNIT) PO CAPS: 50000.0000 [IU] | ORAL_CAPSULE | ORAL | 3 refills | Status: DC

## 2018-12-10 NOTE — Telephone Encounter (Signed)
Medication sent to pharmacy per Dr. Raliegh Scarlet request. Please see labs. AS, CMA

## 2018-12-17 ENCOUNTER — Encounter: Payer: Self-pay | Admitting: Family Medicine

## 2018-12-19 ENCOUNTER — Ambulatory Visit: Payer: BC Managed Care – PPO | Attending: Internal Medicine

## 2018-12-19 DIAGNOSIS — Z20822 Contact with and (suspected) exposure to covid-19: Secondary | ICD-10-CM

## 2018-12-20 LAB — NOVEL CORONAVIRUS, NAA: SARS-CoV-2, NAA: NOT DETECTED

## 2018-12-23 ENCOUNTER — Encounter: Payer: Self-pay | Admitting: Family Medicine

## 2018-12-23 ENCOUNTER — Ambulatory Visit (INDEPENDENT_AMBULATORY_CARE_PROVIDER_SITE_OTHER): Payer: BC Managed Care – PPO | Admitting: Family Medicine

## 2018-12-23 ENCOUNTER — Other Ambulatory Visit: Payer: Self-pay

## 2018-12-23 VITALS — Ht 72.0 in | Wt 272.0 lb

## 2018-12-23 DIAGNOSIS — Z833 Family history of diabetes mellitus: Secondary | ICD-10-CM

## 2018-12-23 DIAGNOSIS — R7301 Impaired fasting glucose: Secondary | ICD-10-CM | POA: Insufficient documentation

## 2018-12-23 DIAGNOSIS — Z713 Dietary counseling and surveillance: Secondary | ICD-10-CM | POA: Diagnosis not present

## 2018-12-23 DIAGNOSIS — E559 Vitamin D deficiency, unspecified: Secondary | ICD-10-CM | POA: Diagnosis not present

## 2018-12-23 DIAGNOSIS — Z7189 Other specified counseling: Secondary | ICD-10-CM | POA: Insufficient documentation

## 2018-12-23 DIAGNOSIS — Z800 Family history of malignant neoplasm of digestive organs: Secondary | ICD-10-CM

## 2018-12-23 DIAGNOSIS — Z8489 Family history of other specified conditions: Secondary | ICD-10-CM

## 2018-12-23 DIAGNOSIS — E669 Obesity, unspecified: Secondary | ICD-10-CM | POA: Diagnosis not present

## 2018-12-23 MED ORDER — VITAMIN D3 125 MCG (5000 UT) PO TABS: ORAL_TABLET | ORAL | 3 refills | Status: DC

## 2018-12-23 NOTE — Progress Notes (Signed)
Telehealth office visit note for Martin Hancock, D.O- at [facility] at [facility]   I connected with current patient today and verified that I am speaking with the correct person using two identifiers.   . Location of the patient: Home . Location of the provider: Office Only the patient (+/- their family members at pt's discretion) and myself were participating in the encounter - This visit type was conducted due to national recommendations for restrictions regarding the COVID-19 Pandemic (e.g. social distancing) in an effort to limit this patient's exposure and mitigate transmission in our community.  This format is felt to be most appropriate for this patient at this time.   - The patient did not have access to video technology or had technical difficulties with video requiring transitioning to audio format only. - No physical exam could be performed with this format, beyond that communicated to [place] by the patient/ family members as noted.   - Additionally my office staff/ schedulers discussed with the patient that there may be a monetary charge related to this service, depending on their medical insurance.   The patient expressed understanding, and agreed to proceed.       History of Present Illness: Patient is here today to review recent lab work.    Martin Hancock, am serving as scribe for Dr. Mellody Hancock.   Notes doing well; "finally getting some time to relax, and not travelling."  - Recent Concerns about COVID States when he came home from working last week, his chest felt "compressed" and he was having some trouble breathing.  At that time, he felt tired and achy, and decided to obtain COVID testing.  His COVID testing returned negative.    Patient denies symptoms at present.  He believes the tiredness and fatigue were from working, and the aching in particular was from working more/harder recently.  Regarding recent symptoms, states "it wasn't really hard  to breathe, but every 30 seconds I had to take a deep a breath, and my breaths were shorter than normal."  He is feeling well now.   - Historical Lab Work Knows he had a physical in the last five years, but isn't sure what lab work was obtained back then.    He last Saw Carlos Levering of [facility]  - Concerns of Food Intolerance Notes recently stopped drinking as much milk and dairy because usually within 30-45 minutes of consuming foods like this, he is "running to the bathroom and it's coming right back out."  Notes some concerns regarding his digestion / food intolerance.  Patient had colonoscopy done in 04/04/06, and patient's father died in his mid 34 after a history of colon cancer.  Patient is not entirely sure of his father's medical history.  He has an upcoming appointment with gastroenterology to see about obtaining further screening.  - Desire for Weight Loss Notes he wishes to lost weight and improve his overall health, particularly to be there for his family in every capacity.  Patient feels if he checks in more frequently, he will be more accountable about his weight loss.    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   No flowsheet data found.  Depression screen [facility] 2/9 12/09/2018 10/21/2018 05/01/2018  Decreased Interest 0 0 0  Down, Depressed, Hopeless 0 0 0  PHQ - 2 Score 0 0 0  Altered sleeping 0 0 0  Tired, decreased energy 0 0 0  Change in appetite 0 1 0  Feeling bad or failure about yourself  0 0 0  Trouble concentrating 0 0 0  Moving slowly or fidgety/restless 0 0 0  Suicidal thoughts 0 0 0  PHQ-9 Score 0 1 0  Difficult doing work/chores - Not difficult at all Not difficult at all      Impression and Recommendations:    1. Obesity (BMI 30-39.9)-   36.89    2. Weight loss counseling, encounter for   3. Elevated fasting glucose   4. Vitamin D deficiency   5. Patient's father is  deceased- pt. doesn't know his med hx other than died mid-60's.    24. Family history of colon cancer in father- in early 19's   7. Family history of diabetes mellitus (DM)   8. Counseling on health promotion and disease prevention      Family Hx Colon Cancer in Father, early 71's - Per patient, with occasional GI distress after eating certain foods. - Patient with h/o of colonoscopy screening in 2008.  - Patient follows up with gastroenterology near future.  Encouraged pt to ask specialist regarding digestion or concerns regarding food intolerance/sensitivity.  - In addition, encouraged patient to keep a food journal to assess what foods may contribute to GI irritation / symptoms.  - Education provided to patient today regarding assessment of GI distress.  - Lengthy discussion held and all questions answered.  Lab Review - Reviewed recent lab work (12/09/2018) in depth with patient today.  All lab work within normal limits unless otherwise noted.  Extensive education provided during appointment today.  - Per patient, last obtained lab work through [facility].  Asked patient to have all historical lab work / records sent to clinic.  - To help preserve organ health, discussed importance of adequate hydration, regular physical activity, controlled blood pressure, controlled blood sugar, and avoiding hepatotoxic / nephrotoxic substances.  Vitamin D Deficiency - Last measured at 12.0, low. - Reviewed goal range of 40-60. - Managed on once-weekly prescription Vitamin D.   - Advised patient to incorporate additional 5000 IU's OTC Vitamin D. - Encouraged patient to take all supplementation as prescribed.  See med list.  - Will continue to monitor and re-check in 2-3 months as recommended.  Fasting Lipid Profile - Triglycerides = 74 - HDL = 53 - LDL = 93  The 10-year ASCVD risk score Mikey Bussing DC Jr., et al., 2013) is: 1.5%  - Cholesterol levels are currently  within normal limits.  - Given patient's unknown family history regarding cholesterol, ongoing prudent dietary changes encouraged.  Discussed low saturated & trans fat diets for hyperlipidemia and low carb diets for hypertriglyceridemia.  - To help increase HDL to ideal 60+ range, encouraged patient to follow AHA guidelines for regular exercise 30-45 minutes daily, and also engage in weight loss if BMI above 25.   - Educational handouts provided at patient's desire and/ or told to look online at the [place] website for further information.  - We will continue to monitor and re-check as recommended.  Elevated Fasting Glucose - 103 last fasting metabolic panel.  - Education provided today regarding maintaining blood sugar WNL. - Emphasized health benefits of lower-carb diet and increased formal exercise. - Will check fasting insulin level in future.  - Will continue to monitor.  BMI Counseling - Body mass index is 36.89 kg/m, Weight Loss Counseling Explained to patient what BMI  refers to, and what it means medically.    Told patient to think about it as a "medical risk stratification measurement" and how increasing BMI is associated with increasing risk/ or worsening state of various diseases such as hypertension, hyperlipidemia, diabetes, premature OA, depression etc.  American Heart Association guidelines for healthy diet, basically Mediterranean diet, and exercise guidelines of 30 minutes 5 days per week or more discussed in detail.  - Discussed importance of prudent, healthy weight loss to a goal BMI below 30.  - Reviewed 4 lbs in 4 weeks as an appropriate weight loss goal.  - Encouraged patient to download a free tracking app such as LoseIt or MyFitnessPal.  - Health counseling performed.  All questions answered.  Counseling on Health Promotion and Disease Prevention - Advised patient to continue working toward exercising to improve overall mental, physical, and  emotional health.    - Reviewed the "spokes of the wheel" of mood and health management.  Stressed the importance of ongoing prudent habits, including regular exercise, appropriate sleep hygiene, healthful dietary habits, and prayer/meditation to relax.  - Encouraged patient to engage in daily physical activity, especially a formal exercise routine.  Recommended that the patient eventually strive for at least 150 minutes of moderate cardiovascular activity per week according to guidelines established by the [facility].   - Healthy dietary habits encouraged, including low-carb, and high amounts of lean protein in diet.   - Patient should also consume adequate amounts of water.  Recommendations - Re-check Vitamin D in 2-3 months. - Patient desires monthly follow-up for keeping him accountable with weight loss goals. - Goal set for 4 lbs weight loss in one month- being over X-mas etc.   - Additionally, discussion had with patient regarding our treatment plan, and their biases/concerns about that plan were used in my medical decision making today.    - The patient agreed with the plan and demonstrated an understanding of the instructions.   No barriers to understanding were identified.     Return for wt loss OV in 1 mo with goal 4 lbs/per mo; will need repeat vit D/fasting insulin in 2-79mo.    Orders Placed This Encounter  Procedures  . VITAMIN D 25 Hydroxy (Vit-D Deficiency, Fractures)  . Insulin, random    Meds ordered this encounter  Medications  . Cholecalciferol (VITAMIN D3) 125 MCG (5000 UT) TABS    Sig: 5,000 IU OTC vitamin D3 daily.    Dispense:  90 tablet    Refill:  3     I provided 32+ minutes of non face-to-face time during this encounter.  Additional time was spent with charting and coordination of care before and after the actual visit commenced.   Note:  This note was prepared with assistance of Dragon voice recognition software. Occasional wrong-word or sound-a-like  substitutions may have occurred due to the inherent limitations of voice recognition software.   This document serves as a record of services personally performed by Martin Dance, DO. It was created on her behalf by Toni Amend, a trained medical scribe. The creation of this record is based on the scribe's personal observations and the provider's statements to them.   This case required medical decision making of at least moderate complexity. The above documentation has been reviewed to be accurate and was completed by Marjory Sneddon, D.O.      Patient Care Team    Relationship Specialty Notifications Start End  Martin Dance, DO PCP - General Family Medicine  2018-05-31   Thurman Coyer, DO Consulting Physician Sports Medicine  10/21/18   Milus Banister, MD Attending Physician Gastroenterology  12/23/18      -Vitals obtained; medications/ allergies reconciled;  personal medical, social, Sx etc.histories were updated by CMA, reviewed by me and are reflected in chart   Patient Active Problem List   Diagnosis Date Noted  . Obesity (BMI 30-39.9) 05/31/2018  . Environmental and seasonal allergies 05/31/18  . Family history of colon cancer in father- in early 62's 31-May-2018  . Patient's father is deceased- pt. doesn't know his med hx other than died mid-60's.  May 31, 2018  . History of smoking for 6-10 years 05-31-2018  . Family history of diabetes mellitus (DM) 05-31-2018  . h/o GERD 08/21/2007  . Vitamin D deficiency 12/23/2018  . Counseling on health promotion and disease prevention 12/23/2018  . Weight loss counseling, encounter for 12/23/2018  . Elevated fasting glucose 12/23/2018  . Chronic right shoulder pain 10/21/2018  . Right tennis elbow 10/21/2018  . h/o Carpal tunnel syndrome of right wrist 10/21/2018  . Impingement syndrome of right shoulder 10/21/2018  . Rotator cuff tendinitis, right 10/21/2018  . Overuse injury 10/21/2018  . Family history of  uterine cancer- Mom May 31, 2018  . Family history of breast cancer- aunt 2018/05/31  . History of back surgery May 31, 2018  . STREP THROAT 01/13/2009  . CONTACT DERMATITIS&OTHER ECZEMA DUE TO PLANTS 03/30/2008  . EPIDEMIC HEMORRHAGIC CONJUNCTIVITIS 02/09/2008  . DIARRHEA 08/21/2007  . ABDOMINAL PAIN-RLQ 08/21/2007  . ABDOMINAL PAIN RIGHT UPPER QUADRANT 07/30/2007  . ROM 12/16/2006  . VIRAL URI 12/13/2006  . DIVERTICULOSIS, COLON 08/17/2006     No outpatient medications have been marked as taking for the 12/23/18 encounter (Office Visit) with Martin Dance, DO.     Allergies:  No Known Allergies   ROS:  See above HPI for pertinent positives and negatives   Objective:   Height 6' (1.829 m), weight 272 lb (123.4 kg).  (if some vitals are omitted, this means that patient was UNABLE to obtain them even though they were asked to get them prior to OV today.  They were asked to call us at their earliest convenience with these once obtained. )  General: A & O * 3; sounds in no acute distress; in usual state of health.  Skin: Pt confirms warm and dry extremities and pink fingertips HEENT: Pt confirms lips non-cyanotic Chest: Patient confirms normal chest excursion and movement Respiratory: speaking in full sentences, no conversational dyspnea; patient confirms no use of accessory muscles Psych: insight appears good, mood- appears full

## 2018-12-23 NOTE — Patient Instructions (Addendum)
Nine ways to increase your "good" HDL cholesterol  High-density lipoprotein (HDL) is often referred to as the "good" cholesterol. Having high HDL levels helps carry cholesterol from your arteries to your liver, where it can be used or excreted.  Having high levels of HDL also has antioxidant and anti-inflammatory effects, and is linked to a reduced risk of heart disease (1, 2).  Most health experts recommend minimum blood levels of 40 mg/dl in men and 50 mg/dl in women.  While genetics definitely play a role, there are several other factors that affect HDL levels.  Here are nine healthy ways to raise your "good" HDL cholesterol.  1. Consume olive oil  two pieces of salmon on a plate olive oil being poured into a small dish Extra virgin olive oil may be more healthful than processed olive oils. Olive oil is one of the healthiest fats around.  A large analysis of 42 studies with more than 800,000 participants found that olive oil was the only source of monounsaturated fat that seemed to reduce heart disease risk (3).  Research has shown that one of olive oil's heart-healthy effects is an increase in HDL cholesterol. This effect is thought to be caused by antioxidants it contains called polyphenols (4, 5, 6, 7).  Extra virgin olive oil has more polyphenols than more processed olive oils, although the amount can still vary among different types and brands.  One study gave 200 healthy young men about 2 tablespoons (25 ml) of different olive oils per day for three weeks.  The researchers found that participants' HDL levels increased significantly more after they consumed the olive oil with the highest polyphenol content (6).  In another study, when 17 older adults consumed about 4 tablespoons (50 ml) of high-polyphenol extra virgin olive oil every day for six weeks, their HDL cholesterol increased by 6.5 mg/dl, on average (7).  In addition to raising HDL levels, olive oil has been  found to boost HDL's anti-inflammatory and antioxidant function in studies of older people and individuals with high cholesterol levels ( 7, 8, 9).  Whenever possible, select high-quality, certified extra virgin olive oils, which tend to be highest in polyphenols.  Bottom line: Extra virgin olive oil with a high polyphenol content has been shown to increase HDL levels in healthy people, the elderly and individuals with high cholesterol.  2. Follow a low-carb or ketogenic diet  Low-carb and ketogenic diets provide a number of health benefits, including weight loss and reduced blood sugar levels.  They have also been shown to increase HDL cholesterol in people who tend to have lower levels.  This includes those who are obese, insulin resistant or diabetic (10, 11, 12, 13, 14, 15, 16, 17).  In one study, people with type 2 diabetes were split into two groups.  One followed a diet consuming less than 50 grams of carbs per day. The other followed a high-carb diet.  Although both groups lost weight, the low-carb group's HDL cholesterol increased almost twice as much as the high-carb group's did (14).  In another study, obese people who followed a low-carb diet experienced an increase in HDL cholesterol of 5 mg/dl overall.  Meanwhile, in the same study, the participants who ate a low-fat, high-carb diet showed a decrease in HDL cholesterol (15).  This response may partially be due to the higher levels of fat people typically consume on low-carb diets.  One study in overweight women found that diets high in meat and cheese  increased HDL levels by 5-8%, compared to a higher-carb diet (18).  What's more, in addition to raising HDL cholesterol, very-low-carb diets have been shown to decrease triglycerides and improve several other risk factors for heart disease (13, 14, 16, 17).  Bottom line: Low-carb and ketogenic diets typically increase HDL cholesterol levels in people with diabetes, metabolic  syndrome and obesity.  3. Exercise regularly  Being physically active is important for heart health.  Studies have shown that many different types of exercise are effective at raising HDL cholesterol, including strength training, high-intensity exercise and aerobic exercise (19, 20, 21, 22, 23, 24).  However, the biggest increases in HDL are typically seen with high-intensity exercise.  One small study followed women who were living with polycystic ovary syndrome (PCOS), which is linked to a higher risk of insulin resistance. The study required them to perform high-intensity exercise three times a week.  The exercise led to an increase in HDL cholesterol of 8 mg/dL after 10 weeks. The women also showed improvements in other health markers, including decreased insulin resistance and improved arterial function (23).  In a 12-week study, overweight men who performed high-intensity exercise experienced a 10% increase in HDL cholesterol.  In contrast, the low-intensity exercise group showed only a 2% increase and the endurance training group experienced no change (24).  However, even lower-intensity exercise seems to increase HDL's anti-inflammatory and antioxidant capacities, whether or not HDL levels change (20, 21, 25).  Overall, high-intensity exercise such as high-intensity interval training (HIIT) and high-intensity circuit training (HICT) may boost HDL cholesterol levels the most.  Bottom line: Exercising several times per week can help raise HDL cholesterol and enhance its anti-inflammatory and antioxidant effects. High-intensity forms of exercise may be especially effective.  4. Add coconut oil to your diet  Studies have shown that coconut oil may reduce appetite, increase metabolic rate and help protect brain health, among other benefits.  Some people may be concerned about coconut oil's effects on heart health due to its high saturated fat content.  However, it appears that  coconut oil is actually quite heart healthy.  Coconut oil tends to raise HDL cholesterol more than many other types of fat.  In addition, it may improve the ratio of low-density-lipoprotein (LDL) cholesterol, the "bad" cholesterol, to HDL cholesterol. Improving this ratio reduces heart disease risk (26, 27, 28, 29).  One study examined the health effects of coconut oil on 49 women with excess belly fat. The researchers found that participants who took coconut oil daily experienced increased HDL cholesterol and a lower LDL-to-HDL ratio.  In contrast, the group who took soybean oil daily had a decrease in HDL cholesterol and an increase in the LDL-to-HDL ratio (29).  Most studies have found these health benefits occur at a dosage of about 2 tablespoons (30 ml) of coconut oil per day. It's best to incorporate this into cooking rather than eating spoonfuls of coconut oil on their own.  Bottom line: Consuming 2 tablespoons (30 ml) of coconut oil per day may help increase HDL cholesterol levels.  5. Stop smoking  cigarette butt Quitting smoking can reduce the risk of heart disease and lung cancer. Smoking increases the risk of many health problems, including heart disease and lung cancer (30).  One of its negative effects is a suppression of HDL cholesterol.  Some studies have found that quitting smoking can increase HDL levels. Indeed, one study found no significant differences in HDL levels between former smokers and people who had never  smoked (31, 32, 33, 34, 35).  In a one-year study of more than 1,500 people, those who quit smoking had twice the increase in HDL as those who resumed smoking within the year. The number of large HDL particles also increased, which further reduced heart disease risk (32).  One study followed smokers who switched from traditional cigarettes to electronic cigarettes for one year. They found that the switch was associated with an increase in HDL cholesterol of 5  mg/dl, on average (33).  When it comes to the effect of nicotine replacement patches on HDL levels, research results have been mixed.  One study found that nicotine replacement therapy led to higher HDL cholesterol. However, other research suggests that people who use nicotine patches likely won't see increases in HDL levels until after replacement therapy is completed (34, 36).  Even in studies where HDL cholesterol levels didn't increase after people quit smoking, HDL function improved, resulting in less inflammation and other beneficial effects on heart health (37).  Bottom line: Quitting smoking can increase HDL levels, improve HDL function and help protect heart health.  6. Lose weight  When overweight and obese people lose weight, their HDL cholesterol levels usually increase.  What's more, this benefit seems to occur whether weight loss is achieved by calorie counting, carb restriction, intermittent fasting, weight loss surgery or a combination of diet and exercise (16, 38, 39, 40, 41, 42).  One study examined HDL levels in more than 3,000 overweight and obese [place] adults who followed a lifestyle modification program for one year.  The researchers found that losing at least 6.6 lbs (3 kg) led to an increase in HDL cholesterol of 4 mg/dl, on average (41).  In another study, when obese people with type 2 diabetes consumed calorie-restricted diets that provided 20-30% of calories from protein, they experienced significant increases in HDL cholesterol levels (42).  The key to achieving and maintaining healthy HDL cholesterol levels is choosing the type of diet that makes it easiest for you to lose weight and keep it off.  Bottom Line: Several methods of weight loss have been shown to increase HDL cholesterol levels in people who are overweight or obese.  7. Choose purple produce  Consuming purple-colored fruits and vegetables is a delicious way to potentially increase HDL  cholesterol.  Purple produce contains antioxidants known as anthocyanins.  Studies using anthocyanin extracts have shown that they help fight inflammation, protect your cells from damaging free radicals and may also raise HDL cholesterol levels (43, 44, 45, 46).  In a 24-week study of 88 people with diabetes, those who took an anthocyanin supplement twice a day experienced a 19% increase in HDL cholesterol, on average, along with other improvements in heart health markers (45).  In another study, when people with cholesterol issues took anthocyanin extract for 12 weeks, their HDL cholesterol levels increased by 13.7% (46).  Although these studies used extracts instead of foods, there are several fruits and vegetables that are very high in anthocyanins. These include eggplant, purple corn, red cabbage, blueberries, blackberries and black raspberries.  Bottom line: Consuming fruits and vegetables rich in anthocyanins may help increase HDL cholesterol levels.  8. Eat fatty fish often  The omega-3 fats in fatty fish provide major benefits to heart health, including a reduction in inflammation and better functioning of the cells that line your arteries (47, 48).  There's some research showing that eating fatty fish or taking fish oil may also help raise low levels of HDL cholesterol (  49, 50, 51, 52, 53).  In a study of 33 heart disease patients, participants that consumed fatty fish four times per week experienced an increase in HDL cholesterol levels. The particle size of their HDL also increased (52).  In another study, overweight men who consumed herring five days a week for six weeks had a 5% increase in HDL cholesterol, compared with their levels after eating lean pork and chicken five days a week (53).  However, there are a few studies that found no increase in HDL cholesterol in response to increased fish or omega-3 supplement intake (54, 55).  In addition to herring, other types of fatty  fish that may help raise HDL cholesterol include salmon, sardines, mackerel and anchovies.  Bottom line: Eating fatty fish several times per week may help increase HDL cholesterol levels and provide other benefits to heart health.  9. Avoid artificial trans fats  Artificial trans fats have many negative health effects due to their inflammatory properties (56, 57).  There are two types of trans fats. One kind occurs naturally in animal products, including full-fat dairy.  In contrast, the artificial trans fats found in margarines and processed foods are created by adding hydrogen to unsaturated vegetable and seed oils. These fats are also known as industrial trans fats or partially hydrogenated fats.  Research has shown that, in addition to increasing inflammation and contributing to several health problems, these artificial trans fats may lower HDL cholesterol levels.  In one study, researchers compared how people's HDL levels responded when they consumed different margarines.  The study found that participants' HDL cholesterol levels were 10% lower after consuming margarine containing partially hydrogenated soybean oil, compared to their levels after consuming palm oil (58).  Another controlled study followed 40 adults who had diets high in different types of trans fats.  They found that HDL cholesterol levels in women were significantly lower after they consumed the diet high in industrial trans fats, compared to the diet containing naturally occurring trans fats (59).  To protect heart health and keep HDL cholesterol in the healthy range, it's best to avoid artificial trans fats altogether.  Bottom line: Artificial trans fats have been shown to lower HDL levels and increase inflammation, compared to other fats.  Take home message  Although your HDL cholesterol levels are partly determined by your genetics, there are many things you can do to naturally increase your own  levels.  Fortunately, the practices that raise HDL cholesterol often provide other health benefits as well.         Guidelines for a Low Cholesterol, Low Saturated Fat Diet   Fats - Limit total intake of fats and oils. - Avoid butter, stick margarine, shortening, lard, palm and coconut oils. - Limit mayonnaise, salad dressings, gravies and sauces, unless they are homemade with low-fat ingredients. - Limit chocolate. - Choose low-fat and nonfat products, such as low-fat mayonnaise, low-fat or non-hydrogenated peanut butter, low-fat or fat-free salad dressings and nonfat gravy. - Use vegetable oil, such as canola or olive oil. - Look for margarine that does not contain trans fatty acids. - Use nuts in moderate amounts. - Read ingredient labels carefully to determine both amount and type of fat present in foods. Limit saturated and trans fats! - Avoid high-fat processed and convenience foods.  Meats and Meat Alternatives - Choose fish, chicken, [place] and lean meats. - Use dried beans, peas, lentils and tofu. - Limit egg yolks to three to four per week. - If you eat  red meat, limit to no more than three servings per week and choose loin or round cuts. - Avoid fatty meats, such as bacon, sausage, franks, luncheon meats and ribs. - Avoid all organ meats, including liver.  Dairy - Choose nonfat or low-fat milk, yogurt and cottage cheese. - Most cheeses are high in fat. Choose cheeses made from non-fat milk, such as mozzarella and ricotta cheese. - Choose light or fat-free cream cheese and sour cream. - Avoid cream and sauces made with cream.  Fruits and Vegetables - Eat a wide variety of fruits and vegetables. - Use lemon juice, vinegar or "mist" olive oil on vegetables. - Avoid adding sauces, fat or oil to vegetables.  Breads, Cereals and Grains - Choose whole-grain breads, cereals, pastas and rice. - Avoid high-fat snack foods, such as granola, cookies, pies, pastries,  doughnuts and croissants.  Cooking Tips - Avoid deep fried foods. - Trim visible fat off meats and remove skin from poultry before cooking. - Bake, broil, boil, poach or roast poultry, fish and lean meats. - Drain and discard fat that drains out of meat as you cook it. - Add little or no fat to foods. - Use vegetable oil sprays to grease pans for cooking or baking. - Steam vegetables. - Use herbs or no-oil marinades to flavor foods.    Behavior Modification Ideas for Weight Management  Weight management involves adopting a healthy lifestyle that includes a knowledge of nutrition and exercise, a positive attitude and the right kind of motivation. Internal motives such as better health, increased energy, self-esteem and personal control increase your chances of lifelong weight management success.  Remember to have realistic goals and think long-term success. Believe in yourself and you can do it. The following information will give you ideas to help you meet your goals.  Control Your Home Environment  Eat only while sitting down at the kitchen or dining room table. Do not eat while watching television, reading, cooking, talking on the phone, standing at the refrigerator or working on the computer. Keep tempting foods out of the house -- don't buy them. Keep tempting foods out of sight. Have low-calorie foods ready to eat. Unless you are preparing a meal, stay out of the kitchen. Have healthy snacks at your disposal, such as small pieces of fruit, vegetables, canned fruit, pretzels, low-fat string cheese and nonfat cottage cheese.  Control Your Work Environment  Do not eat at [employer] or keep tempting snacks at your desk. If you get hungry between meals, plan healthy snacks and bring them with you to work. During your breaks, go for a walk instead of eating. If you work around food, plan in advance the one item you will eat at mealtime. Make it inconvenient to nibble on food by  chewing gum, sugarless candy or drinking water or another low-calorie beverage. Do not work through meals. Skipping meals slows down metabolism and may result in overeating at the next meal. If food is available for special occasions, either pick the healthiest item, nibble on low-fat snacks brought from home, don't have anything offered, choose one option and have a small amount, or have only a beverage.  Control Your Mealtime Environment  Serve your plate of food at the stove or kitchen counter. Do not put the serving dishes on the table. If you do put dishes on the table, remove them immediately when finished eating. Fill half of your plate with vegetables, a quarter with lean protein and a quarter with starch.  Use smaller plates, bowls and glasses. A smaller portion will look large when it is in a little dish. Politely refuse second helpings. When fixing your plate, limit portions of food to one scoop/serving or less.   Daily Food Management  Replace eating with another activity that you will not associate with food. Wait 20 minutes before eating something you are craving. Drink a large glass of water or diet soda before eating. Always have a big glass or bottle of water to drink throughout the day. Avoid high-calorie add-ons such as cream with your coffee, butter, mayonnaise and salad dressings.  Shopping: Do not shop when hungry or tired. Shop from a list and avoid buying anything that is not on your list. If you must have tempting foods, buy individual-sized packages and try to find a lower-calorie alternative. Don't taste test in the store. Read food labels. Compare products to help you make the healthiest choices.  Preparation: Chew a piece of gum while cooking meals. Use a quarter teaspoon if you taste test your food. Try to only fix what you are going to eat, leaving yourself no chance for seconds. If you have prepared more food than you need, portion it into individual  containers and freeze or refrigerate immediately. Don't snack while cooking meals.  Eating: Eat slowly. Remember it takes about 20 minutes for your stomach to send a message to your brain that it is full. Don't let fake hunger make you think you need more. The ideal way to eat is to take a bite, put your utensil down, take a sip of water, cut your next bite, take a bit, put your utensil down and so on. Do not cut your food all at one time. Cut only as needed. Take small bites and chew your food well. Stop eating for a minute or two at least once during a meal or snack. Take breaks to reflect and have conversation.  Cleanup and Leftovers: Label leftovers for a specific meal or snack. Freeze or refrigerate individual portions of leftovers. Do not clean up if you are still hungry.  Eating Out and Social Eating  Do not arrive hungry. Eat something light before the meal. Try to fill up on low-calorie foods, such as vegetables and fruit, and eat smaller portions of the high-calorie foods. Eat foods that you like, but choose small portions. If you want seconds, wait at least 20 minutes after you have eaten to see if you are actually hungry or if your eyes are bigger than your stomach. Limit alcoholic beverages. Try a soda water with a twist of lime. Do not skip other meals in the day to save room for the special event.  At Restaurants: Order  la carte rather than buffet style. Order some vegetables or a salad for an appetizer instead of eating bread. If you order a high-calorie dish, share it with someone. Try an after-dinner mint with your coffee. If you do have dessert, share it with two or more people. Don't overeat because you do not want to waste food. Ask for a doggie bag to take extra food home. Tell the server to put half of your entree in a to go bag before the meal is served to you. Ask for salad dressing, gravy or high-fat sauces on the side. Dip the tip of your fork in the  dressing before each bite. If bread is served, ask for only one piece. Try it plain without butter or oil. At [employer] where oil and  vinegar is served with bread, use only a small amount of oil and a lot of vinegar for dipping.  At a [facility]: Offer to bring a dish, appetizer or dessert that is low in calories. Serve yourself small portions or tell the host that you only want a small amount. Stand or sit away from the snack table. Stay away from the kitchen or stay busy if you are near the food. Limit your alcohol intake.  At [employer] and Cafeterias: Cover most of your plate with lettuce and/or vegetables. Use a salad plate instead of a dinner plate. After eating, clear away your dishes before having coffee or tea.  Entertaining at Home: Explore low-fat, low-cholesterol cookbooks. Use single-serving foods like chicken breasts or hamburger patties. Prepare low-calorie appetizers and desserts.   Holidays: Keep tempting foods out of sight. Decorate the house without using food. Have low-calorie beverages and foods on hand for guests. Allow yourself one planned treat a day. Don't skip meals to save up for the holiday feast. Eat regular, planned meals.   Exercise Well  Make exercise a priority and a planned activity in the day. If possible, walk the entire or part of the distance to work. Get an exercise buddy. Go for a walk with a colleague during one of your breaks, go to the gym, run or take a walk with a friend, walk in the mall with a shopping companion. Park at the end of the parking lot and walk to the store or office entrance. Always take the stairs all of the way or at least part of the way to your floor. If you have a desk job, walk around the office frequently. Do leg lifts while sitting at your desk. Do something outside on the weekends like going for a hike or a bike ride.   Have a Healthy Attitude  Make health your weight management priority. Be  realistic. Have a goal to achieve a healthier you, not necessarily the lowest weight or ideal weight based on calculations or tables. Focus on a healthy eating style, not on dieting. Dieting usually lasts for a short amount of time and rarely produces long-term success. Think long term. You are developing new healthy behaviors to follow next month, in a year and in a decade.    This information is for educational purposes only and is not intended to replace the advice of your doctor or health care provider. We encourage you to discuss with your doctor any questions or concerns you may have.        Guidelines for Losing Weight   We want weight loss that will last so you should lose 1-2 pounds a week.  THAT IS IT! Please pick THREE things a month to change. Once it is a habit check off the item. Then pick another three items off the list to become habits.  If you are already doing a habit on the list GREAT!  Cross that item off!  Don't drink your calories. Ie, alcohol, soda, fruit juice, and sweet tea.   Drink more water. Drink a glass when you feel hungry or before each meal.   Eat breakfast - Complex carb and protein (likeDannon light and fit yogurt, oatmeal, fruit, eggs, [place] bacon).  Measure your cereal.  Eat no more than one cup a day. (ie Kashi)  Eat an apple a day.  Add a vegetable a day.  Try a new vegetable a month.  Use Pam! Stop using oil or butter to cook.  Don't finish your plate or use smaller plates.  Share your dessert.  Eat sugar free Jello for dessert or frozen grapes.  Don't eat 2-3 hours before bed.  Switch to whole wheat bread, pasta, and brown rice.  Make healthier choices when you eat out. No fries!  Pick baked chicken, NOT fried.  Don't forget to SLOW DOWN when you eat. It is not going anywhere.   Take the stairs.  Park far away in the parking lot  Lift soup cans (or weights) for 10 minutes while watching TV.  Walk at work for 10 minutes  during break.  Walk outside 1 time a week with your friend, kids, dog, or significant other.  Start a walking group at church.  Walk the mall as much as you can tolerate.   Keep a food diary.  Weigh yourself daily.  Walk for 15 minutes 3 days per week.  Cook at home more often and eat out less. If life happens and you go back to old habits, it is okay.  Just start over. You can do it!  If you experience chest pain, get short of breath, or tired during the exercise, please stop immediately and inform your doctor.    Before you even begin to attack a weight-loss plan, it pays to remember this: You are not fat. You have fat. Losing weight isn't about blame or shame; it's simply another achievement to accomplish. Dieting is like any other skill--you have to buckle down and work at it. As long as you act in a smart, reasonable way, you'll ultimately get where you want to be. Here are some weight loss pearls for you.   1. It's Not a Diet. It's a Lifestyle Thinking of a diet as something you're on and suffering through only for the short term doesn't work. To shed weight and keep it off, you need to make permanent changes to the way you eat. It's OK to indulge occasionally, of course, but if you cut calories temporarily and then revert to your old way of eating, you'll gain back the weight quicker than you can say yo-yo. Use it to lose it. Research shows that one of the best predictors of long-term weight loss is how many pounds you drop in the first month. For that reason, nutritionists often suggest being stricter for the first two weeks of your new eating strategy to build momentum. Cut out added sugar and alcohol and avoid unrefined carbs. After that, figure out how you can reincorporate them in a way that's healthy and maintainable.  2. There's a Right Way to Exercise Working out burns calories and fat and boosts your metabolism by building muscle. But those trying to lose weight are  notorious for overestimating the number of calories they burn and underestimating the amount they take in. Unfortunately, your system is biologically programmed to hold on to extra pounds and that means when you start exercising, your body senses the deficit and ramps up its hunger signals. If you're not diligent, you'll eat everything you burn and then some. Use it, to lose it. Cardio gets all the exercise glory, but strength and interval training are the real heroes. They help you build lean muscle, which in turn increases your metabolism and calorie-burning ability 3. Don't Overreact to Mild Hunger Some people have a hard time losing weight because of hunger anxiety. To them, being hungry is bad--something to be avoided at all costs--so they carry snacks with them and eat when they don't  need to. Others eat because they're stressed out or bored. While you never want to get to the point of being ravenous (that's when bingeing is likely to happen), a hunger pang, a craving, or the fact that it's 3:00 p.m. should not send you racing for the vending machine or obsessing about the energy bar in your purse. Ideally, you should put off eating until your stomach is growling and it's difficult to concentrate.  Use it to lose it. When you feel the urge to eat, use the HALT method. Ask yourself, Am I really hungry? Or am I angry or anxious, lonely or bored, or tired? If you're still not certain, try the apple test. If you're truly hungry, an apple should seem delicious; if it doesn't, something else is going on. Or you can try drinking water and making yourself busy, if you are still hungry try a healthy snack.  4. Not All Calories Are Created Equal The mechanics of weight loss are pretty simple: Take in fewer calories than you use for energy. But the kind of food you eat makes all the difference. Processed food that's high in saturated fat and refined starch or sugar can cause inflammation that disrupts the hormone  signals that tell your brain you're full. The result: You eat a lot more.  Use it to lose it. Clean up your diet. Swap in whole, unprocessed foods, including vegetables, lean protein, and healthy fats that will fill you up and give you the biggest nutritional bang for your calorie buck. In a few weeks, as your brain starts receiving regular hunger and fullness signals once again, you'll notice that you feel less hungry overall and naturally start cutting back on the amount you eat.  5. Protein, Produce, and Plant-Based Fats Are Your Weight-Loss Trinity Here's why eating the three Ps regularly will help you drop pounds. Protein fills you up. You need it to build lean muscle, which keeps your metabolism humming so that you can torch more fat. People in a weight-loss program who ate double the recommended daily allowance for protein (about 110 grams for a 150-pound woman) lost 70 percent of their weight from fat, while people who ate the RDA lost only about 40 percent, one study found. Produce is packed with filling fiber. "It's very difficult to consume too many calories if you're eating a lot of vegetables. Example: Three cups of broccoli is a lot of food, yet only 93 calories. (Fruit is another story. It can be easy to overeat and can contain a lot of calories from sugar, so be sure to monitor your intake.) Plant-based fats like olive oil and those in avocados and nuts are healthy and extra satiating.  Use it to lose it. Aim to incorporate each of the three Ps into every meal and snack. People who eat protein throughout the day are able to keep weight off, according to a study in the [place] of Clinical Nutrition. In addition to meat, poultry and seafood, good sources are beans, lentils, eggs, tofu, and yogurt. As for fat, keep portion sizes in check by measuring out salad dressing, oil, and nut butters (shoot for one to two tablespoons). Finally, eat veggies or a little fruit at every meal. People  who did that consumed 308 fewer calories but didn't feel any hungrier than when they didn't eat more produce.  7. How You Eat Is As Important As What You Eat In order for your brain to register that you're full, you need to  focus on what you're eating. Sit down whenever you eat, preferably at a table. Turn off the TV or computer, put down your phone, and look at your food. Smell it. Chew slowly, and don't put another bite on your fork until you swallow. When women ate lunch this attentively, they consumed 30 percent less when snacking later than those who listened to an audiobook at lunchtime, according to a study in the [place] of Nutrition. 8. Weighing Yourself Really Works The scale provides the best evidence about whether your efforts are paying off. Seeing the numbers tick up or down or stagnate is motivation to keep going--or to rethink your approach. A 2015 study at [facility] found that daily weigh-ins helped people lose more weight, keep it off, and maintain that loss, even after two years. Use it to lose it. Step on the scale at the same time every day for the best results. If your weight shoots up several pounds from one weigh-in to the next, don't freak out. Eating a lot of salt the night before or having your period is the likely culprit. The number should return to normal in a day or two. It's a steady climb that you need to do something about. 9. Too Much Stress and Too Little Sleep Are Your Enemies When you're tired and frazzled, your body cranks up the production of cortisol, the stress hormone that can cause carb cravings. Not getting enough sleep also boosts your levels of ghrelin, a hormone associated with hunger, while suppressing leptin, a hormone that signals fullness and satiety. People on a diet who slept only five and a half hours a night for two weeks lost 55 percent less fat and were hungrier than those who slept eight and a half hours, according to a study in the  [place]. Use it to lose it. Prioritize sleep, aiming for seven hours or more a night, which research shows helps lower stress. And make sure you're getting quality zzz's. If a snoring spouse or a fidgety cat wakes you up frequently throughout the night, you may end up getting the equivalent of just four hours of sleep, according to a study from [facility]. Keep pets out of the bedroom, and use a white-noise app to drown out snoring. 10. You Will Hit a plateau--And You Can Bust Through It As you slim down, your body releases much less leptin, the fullness hormone.  If you're not strength training, start right now. Building muscle can raise your metabolism to help you overcome a plateau. To keep your body challenged and burning calories, incorporate new moves and more intense intervals into your workouts or add another sweat session to your weekly routine. Alternatively, cut an extra 100 calories or so a day from your diet. Now that you've lost weight, your body simply doesn't need as much fuel.    Since food equals calories, in order to lose weight you must either eat fewer calories, exercise more to burn off calories with activity, or both. Food that is not used to fuel the body is stored as fat. A major component of losing weight is to make smarter food choices. Here's how:  1)   Limit non-nutritious foods, such as: Sugar, honey, syrups and candy Pastries, donuts, pies, cakes and cookies Soft drinks, sweetened juices and alcoholic beverages  2)  Cut down on high-fat foods by: - Choosing poultry, fish or lean red meat - Choosing low-fat cooking methods, such as baking, broiling, steaming,  grilling and boiling - Using low-fat or non-fat dairy products - Using vinaigrette, herbs, lemon or fat-free salad dressings - Avoiding fatty meats, such as bacon, sausage, franks, ribs and luncheon meats - Avoiding high-fat snacks like nuts, chips and chocolate - Avoiding  fried foods - Using less butter, margarine, oil and mayonnaise - Avoiding high-fat gravies, cream sauces and cream-based soups  3) Eat a variety of foods, including: - Fruit and vegetables that are raw, steamed or baked - Whole grains, breads, cereal, rice and pasta - Dairy products, such as low-fat or non-fat milk or yogurt, low-fat cottage cheese and low-fat cheese - Protein-rich foods like chicken, [place], fish, lean meat and legumes, or beans  4) Change your eating habits by: - Eat three balanced meals a day to help control your hunger - Watch portion sizes and eat small servings of a variety of foods - Choose low-calorie snacks - Eat only when you are hungry and stop when you are satisfied - Eat slowly and try not to perform other tasks while eating - Find other activities to distract you from food, such as walking, taking up a hobby or being involved in the community - Include regular exercise in your daily routine ( minimum of 20 min of moderate-intensity exercise at least 5 days/week)  - Find a support group, if necessary, for emotional support in your weight loss journey           Easy ways to cut 100 calories   1. Eat your eggs with hot sauce OR salsa instead of cheese.  Eggs are great for breakfast, but many people consider eggs and cheese to be BFFs. Instead of cheese--1 oz. of cheddar has 114 calories--top your eggs with hot sauce, which contains no calories and helps with satiety and metabolism. Salsa is also a great option!!  2. Top your toast, waffles or pancakes with fresh berries instead of jelly or syrup. Half a cup of berries--fresh, frozen or thawed--has about 40 calories, compared with 2 tbsp. of maple syrup or jelly, which both have about 100 calories. The berries will also give you a good punch of fiber, which helps keep you full and satisfied and won't spike blood sugar quickly like the jelly or syrup. 3. Swap the non-fat latte for black coffee with a  splash of half-and-half. Contrary to its name, that non-fat latte has 130 calories and a startling 19g of carbohydrates per 16 oz. serving. Replacing that 'light' drinkable dessert with a black coffee with a splash of half-and-half saves you more than 100 calories per 16 oz. serving. 4. Sprinkle salads with freeze-dried raspberries instead of dried cranberries. If you want a sweet addition to your nutritious salad, stay away from dried cranberries. They have a whopping 130 calories per  cup and 30g carbohydrates. Instead, sprinkle freeze-dried raspberries guilt-free and save more than 100 calories per  cup serving, adding 3g of belly-filling fiber. 5. Go for mustard in place of mayo on your sandwich. Mustard can add really nice flavor to any sandwich, and there are tons of varieties, from spicy to honey. A serving of mayo is 95 calories, versus 10 calories in a serving of mustard.  Or try an avocado mayo spread: You can find the recipe few click this link: https://www.californiaavocado.com/recipes/recipe-container/[place]-avocado-mayo 6. Choose a DIY salad dressing instead of the store-bought kind. Mix Dijon or whole grain mustard with low-fat Kefir or red wine vinegar and garlic. 7. Use hummus as a spread instead of a dip. Use hummus  as a spread on a high-fiber cracker or tortilla with a sandwich and save on calories without sacrificing taste. 8. Pick just one salad "accessory." Salad isn't automatically a calorie winner. It's easy to over-accessorize with toppings. Instead of topping your salad with nuts, avocado and cranberries (all three will clock in at 313 calories), just pick one. The next day, choose a different accessory, which will also keep your salad interesting. You don't wear all your jewelry every day, right? 9. Ditch the white pasta in favor of spaghetti squash. One cup of cooked spaghetti squash has about 40 calories, compared with traditional spaghetti, which comes with more  than 200. Spaghetti squash is also nutrient-dense. It's a good source of fiber and Vitamins A and C, and it can be eaten just like you would eat pasta--with a great tomato sauce and [place] meatballs or with pesto, tofu and spinach, for example. 10. Dress up your chili, soups and stews with non-fat [place] yogurt instead of sour cream. Just a 'dollop' of sour cream can set you back 115 calories and a whopping 12g of fat--seven of which are of the artery-clogging variety. Added bonus: [place] yogurt is packed with muscle-building protein, calcium and B Vitamins. 11. Mash cauliflower instead of mashed potatoes. One cup of traditional mashed potatoes--in all their creamy goodness--has more than 200 calories, compared to mashed cauliflower, which you can typically eat for less than 100 calories per 1 cup serving. Cauliflower is a great source of the antioxidant indole-3-carbinol (I3C), which may help reduce the risk of some cancers, like breast cancer. 12. Ditch the ice cream sundae in favor of a [place] yogurt parfait. Instead of a cup of ice cream or fro-yo for dessert, try 1 cup of nonfat Greek yogurt topped with fresh berries and a sprinkle of cacao nibs. Both toppings are packed with antioxidants, which can help reduce cellular inflammation and oxidative damage. And the comparison is a no-brainer: One cup of ice cream has about 275 calories; one cup of frozen yogurt has about 230; and a cup of Greek yogurt has just 130, plus twice the protein, so you're less likely to return to the freezer for a second helping. 13. Put olive oil in a spray container instead of using it directly from the bottle. Each tablespoon of olive oil is 120 calories and 15g of fat. Use a mister instead of pouring it straight into the pan or onto a salad. This allows for portion control and will save you more than 100 calories. 14. When baking, substitute canned pumpkin for butter or oil. Canned pumpkin--not pumpkin pie mix--is loaded with  Vitamin A, which is important for skin and eye health, as well as immunity. And the comparisons are pretty crazy:  cup of canned pumpkin has about 40 calories, compared to butter or oil, which has more than 800 calories. Yes, 800 calories. Applesauce and mashed banana can also serve as good substitutions for butter or oil, usually in a 1:1 ratio. 15. Top casseroles with high-fiber cereal instead of breadcrumbs. Breadcrumbs are typically made with white bread, while breakfast cereals contain 5-9g of fiber per serving. Not only will you save more than 150 calories per  cup serving, the swap will also keep you more full and you'll get a metabolism boost from the added fiber. 16. Snack on pistachios instead of macadamia nuts. Believe it or not, you get the same amount of calories from 35 pistachios (100 calories) as you would from only five macadamia nuts. 17. Chow  down on kale chips rather than potato chips. This is my favorite 'don't knock it 'till you try it' swap. Kale chips are so easy to make at home, and you can spice them up with a little grated parmesan or chili powder. Plus, they're a mere fraction of the calories of potato chips, but with the same crunch factor we crave so often. 18. Add seltzer and some fruit slices to your cocktail instead of soda or fruit juice. One cup of soda or fruit juice can pack on as much as 140 calories. Instead, use seltzer and fruit slices. The fruit provides valuable phytochemicals, such as flavonoids and anthocyanins, which help to combat cancer and stave off the aging process.

## 2019-01-14 ENCOUNTER — Other Ambulatory Visit: Payer: Self-pay

## 2019-01-14 ENCOUNTER — Ambulatory Visit: Payer: BC Managed Care – PPO | Admitting: Gastroenterology

## 2019-01-14 ENCOUNTER — Encounter: Payer: Self-pay | Admitting: Gastroenterology

## 2019-01-14 VITALS — BP 110/80 | HR 80 | Temp 97.8°F | Ht 72.0 in | Wt 277.0 lb

## 2019-01-14 DIAGNOSIS — Z01818 Encounter for other preprocedural examination: Secondary | ICD-10-CM

## 2019-01-14 DIAGNOSIS — Z800 Family history of malignant neoplasm of digestive organs: Secondary | ICD-10-CM | POA: Diagnosis not present

## 2019-01-14 MED ORDER — NA SULFATE-K SULFATE-MG SULF 17.5-3.13-1.6 GM/177ML PO SOLN: ORAL | 0 refills | Status: DC

## 2019-01-14 NOTE — Patient Instructions (Addendum)
If you are age 46 or older, your body mass index should be between 23-30. Your Body mass index is 37.57 kg/m. If this is out of the aforementioned range listed, please consider follow up with your Primary Care Provider.  If you are age 24 or younger, your body mass index should be between 19-25. Your Body mass index is 37.57 kg/m. If this is out of the aformentioned range listed, please consider follow up with your Primary Care Provider.    You have been scheduled for a colonoscopy. Please follow written instructions given to you at your visit today.  Please pick up your prep supplies at the pharmacy within the next 1-3 days. If you use inhalers (even only as needed), please bring them with you on the day of your procedure. Your physician has requested that you go to [email or web address] and enter the access code given to you at your visit today. This web site gives a general overview about your procedure. However, you should still follow specific instructions given to you by our office regarding your preparation for the procedure.  We have sent the following medications to your pharmacy for you to pick up at your convenience: Suprep  Thank you for choosing me and [facility].  Owens Loffler, MD

## 2019-01-14 NOTE — Progress Notes (Signed)
HPI: This is a very pleasant 44 year old man who was referred to me by Mellody Dance, DO  to evaluate colon cancer screening, family history of colon cancer.    Chief complaint is family history of colon cancer  He has had no GI issues.  Specifically no bowel changes, no overt bleeding.  He has intentionally lost 7 pounds in the past couple weeks as a New Year's resolution, dietary changes.  He recently found out that his father died of colon cancer in his early 64s.  No other family members have had colon cancer that he is aware of  No fevers or chills  Old Data Reviewed: I did a colonoscopy for him April 2008 for recent clinically diagnosed diverticulitis.  The examination was normal.  I did not even see diverticulosis.  Blood work December 2020 shows normal CBC, normal complete metabolic profile    Review of systems: Pertinent positive and negative review of systems were noted in the above HPI section. All other review negative.   History reviewed. No pertinent past medical history.  Past Surgical History:  Procedure Laterality Date  . BACK SURGERY    . BACK SURGERY  2003  . TYMPANOSTOMY TUBE PLACEMENT      Current Outpatient Medications  Medication Sig Dispense Refill  . Cholecalciferol (VITAMIN D3) 125 MCG (5000 UT) TABS 5,000 IU OTC vitamin D3 daily. 90 tablet 3  . gabapentin (NEURONTIN) 300 MG capsule Take 1 capsule (300 mg total) by mouth at bedtime. 30 capsule 0  . ibuprofen (ADVIL) 200 MG tablet Take 200 mg by mouth 4 (four) times daily.    . meloxicam (MOBIC) 7.5 MG tablet Take 1-2 tablets (7.5-15 mg total) by mouth as needed for pain. 60 tablet 0  . naproxen (NAPROSYN) 500 MG tablet Take 1 tablet by mouth 2 (two) times daily.    Marland Kitchen tiZANidine (ZANAFLEX) 4 MG capsule Take 1 capsule by mouth 2 (two) times daily as needed.    . Vitamin D, Ergocalciferol, (DRISDOL) 1.25 MG (50000 UT) CAPS capsule Take 1 capsule (50,000 Units total) by mouth every 7 (seven) days. 12  capsule 3   No current facility-administered medications for this visit.    Allergies as of 01/14/2019  . (No Known Allergies)    Family History  Problem Relation Age of Onset  . Colon cancer Father   . Liver cancer Father   . Diabetes Mother     Social History   Socioeconomic History  . Marital status: Married    Spouse name: Not on file  . Number of children: Not on file  . Years of education: Not on file  . Highest education level: Not on file  Occupational History  . Not on file  Tobacco Use  . Smoking status: Former Smoker    Packs/day: 1.00    Years: 15.00    Pack years: 15.00    Quit date: 2011    Years since quitting: 10.0  . Smokeless tobacco: Never Used  Substance and Sexual Activity  . Alcohol use: Yes    Alcohol/week: 14.0 standard drinks    Types: 14 Standard drinks or equivalent per week  . Drug use: No  . Sexual activity: Yes    Birth control/protection: None  Other Topics Concern  . Not on file  Social History Narrative  . Not on file   Social Determinants of Health   Financial Resource Strain:   . Difficulty of Paying Living Expenses: Not on file  Food Insecurity:   .  Worried About Charity fundraiser in the Last Year: Not on file  . Ran Out of Food in the Last Year: Not on file  Transportation Needs:   . Lack of Transportation (Medical): Not on file  . Lack of Transportation (Non-Medical): Not on file  Physical Activity:   . Days of Exercise per Week: Not on file  . Minutes of Exercise per Session: Not on file  Stress:   . Feeling of Stress : Not on file  Social Connections:   . Frequency of Communication with Friends and Family: Not on file  . Frequency of Social Gatherings with Friends and Family: Not on file  . Attends Religious Services: Not on file  . Active Member of Clubs or Organizations: Not on file  . Attends Archivist Meetings: Not on file  . Marital Status: Not on file  Intimate Partner Violence:   . Fear of  Current or Ex-Partner: Not on file  . Emotionally Abused: Not on file  . Physically Abused: Not on file  . Sexually Abused: Not on file     Physical Exam: BP 110/80   Pulse 80   Temp 97.8 F (36.6 C)   Ht 6' (1.829 m)   Wt 277 lb (125.6 kg)   BMI 37.57 kg/m  Constitutional: generally well-appearing Psychiatric: alert and oriented x3 Eyes: extraocular movements intact Mouth: oral pharynx moist, no lesions Neck: supple no lymphadenopathy Cardiovascular: heart regular rate and rhythm Lungs: clear to auscultation bilaterally Abdomen: soft, nontender, nondistended, no obvious ascites, no peritoneal signs, normal bowel sounds Extremities: no lower extremity edema bilaterally Skin: no lesions on visible extremities   Assessment and plan: 44 y.o. male with significant family history of colon cancer  His father died of colon cancer in his early 67s.  I think it is a very good chance that his father was diagnosed with colon cancer in his 41s at some point.  I recommended colon cancer screening to Mr. Brumley with a colonoscopy and I explained to him that even if it is completely normal at this point with no cancers no polyps I would likely recommend a repeat colonoscopy at 5-year interval, every 5 years given his significant family history.  He understands and agrees.  We will arrange for colonoscopy at his soonest convenience.  I see no reason for any further blood tests or imaging studies prior to then.    Please see the "Patient Instructions" section for addition details about the plan.   Owens Loffler, MD [facility] 01/14/2019, 9:13 AM  Cc: Mellody Dance, DO

## 2019-01-30 ENCOUNTER — Encounter: Payer: Self-pay | Admitting: Gastroenterology

## 2019-01-30 ENCOUNTER — Ambulatory Visit (INDEPENDENT_AMBULATORY_CARE_PROVIDER_SITE_OTHER): Payer: BC Managed Care – PPO

## 2019-01-30 ENCOUNTER — Other Ambulatory Visit: Payer: Self-pay | Admitting: Gastroenterology

## 2019-01-30 DIAGNOSIS — Z1159 Encounter for screening for other viral diseases: Secondary | ICD-10-CM

## 2019-01-30 LAB — SARS CORONAVIRUS 2 (TAT 6-24 HRS): SARS Coronavirus 2: NEGATIVE

## 2019-01-31 ENCOUNTER — Telehealth: Payer: Self-pay | Admitting: Gastroenterology

## 2019-01-31 NOTE — Telephone Encounter (Signed)
Spoke with patient.  He is coming to the office to pick up Suprep sample today.

## 2019-01-31 NOTE — Telephone Encounter (Signed)
Patient called stating that suprep for procedure needs PA. His procedure is next Monday. Please call patient.

## 2019-02-03 ENCOUNTER — Other Ambulatory Visit: Payer: Self-pay

## 2019-02-03 ENCOUNTER — Encounter: Payer: Self-pay | Admitting: Gastroenterology

## 2019-02-03 ENCOUNTER — Ambulatory Visit (AMBULATORY_SURGERY_CENTER): Payer: BC Managed Care – PPO | Admitting: Gastroenterology

## 2019-02-03 VITALS — BP 119/77 | HR 75 | Temp 97.5°F | Resp 17 | Ht 72.0 in | Wt 277.0 lb

## 2019-02-03 DIAGNOSIS — Z800 Family history of malignant neoplasm of digestive organs: Secondary | ICD-10-CM | POA: Diagnosis not present

## 2019-02-03 DIAGNOSIS — Z1211 Encounter for screening for malignant neoplasm of colon: Secondary | ICD-10-CM

## 2019-02-03 DIAGNOSIS — D120 Benign neoplasm of cecum: Secondary | ICD-10-CM

## 2019-02-03 MED ORDER — SODIUM CHLORIDE 0.9 % IV SOLN: 500.0000 mL | Freq: Once | INTRAVENOUS | Status: DC

## 2019-02-03 NOTE — Progress Notes (Signed)
Vitals-CW Temp-JB  History reviewed. 

## 2019-02-03 NOTE — Progress Notes (Signed)
Report given to PACU, vss 

## 2019-02-03 NOTE — Progress Notes (Signed)
Called to room to assist during endoscopic procedure.  Patient ID and intended procedure confirmed with present staff. Received instructions for my participation in the procedure from the performing physician.  

## 2019-02-03 NOTE — Patient Instructions (Signed)
Please, read the handouts given to you by your recovery room nurse.  Thank-you for choosing [place] for your healthcare needs today.  YOU HAD AN ENDOSCOPIC PROCEDURE TODAY AT [facility]:   Refer to the procedure report that was given to you for any specific questions about what was found during the examination.  If the procedure report does not answer your questions, please call your gastroenterologist to clarify.  If you requested that your care partner not be given the details of your procedure findings, then the procedure report has been included in a sealed envelope for you to review at your convenience later.  YOU SHOULD EXPECT: Some feelings of bloating in the abdomen. Passage of more gas than usual.  Walking can help get rid of the air that was put into your GI tract during the procedure and reduce the bloating. If you had a lower endoscopy (such as a colonoscopy or flexible sigmoidoscopy) you may notice spotting of blood in your stool or on the toilet paper. If you underwent a bowel prep for your procedure, you may not have a normal bowel movement for a few days.  Please Note:  You might notice some irritation and congestion in your nose or some drainage.  This is from the oxygen used during your procedure.  There is no need for concern and it should clear up in a day or so.  SYMPTOMS TO REPORT IMMEDIATELY:   Following lower endoscopy (colonoscopy or flexible sigmoidoscopy):  Excessive amounts of blood in the stool  Significant tenderness or worsening of abdominal pains  Swelling of the abdomen that is new, acute  Fever of 100F or higher  For urgent or emergent issues, a gastroenterologist can be reached at any hour by calling [phone].   DIET:  We do recommend a small meal at first, but then you may proceed to your regular diet.  Drink plenty of fluids but you should avoid alcoholic beverages for 24 hours.  ACTIVITY:  You should plan to take it easy for the rest of  today and you should NOT DRIVE or use heavy machinery until tomorrow (because of the sedation medicines used during the test).    FOLLOW UP: Our staff will call the number listed on your records 48-72 hours following your procedure to check on you and address any questions or concerns that you may have regarding the information given to you following your procedure. If we do not reach you, we will leave a message.  We will attempt to reach you two times.  During this call, we will ask if you have developed any symptoms of COVID 19. If you develop any symptoms (ie: fever, flu-like symptoms, shortness of breath, cough etc.) before then, please call [phone].  If you test positive for Covid 19 in the 2 weeks post procedure, please call and report this information to [place].    If any biopsies were taken you will be contacted by phone or by letter within the next 1-3 weeks.  Please call us at [phone] if you have not heard about the biopsies in 3 weeks.    SIGNATURES/CONFIDENTIALITY: You and/or your care partner have signed paperwork which will be entered into your electronic medical record.  These signatures attest to the fact that that the information above on your After Visit Summary has been reviewed and is understood.  Full responsibility of the confidentiality of this discharge information lies with you and/or your care-partner.

## 2019-02-03 NOTE — Op Note (Signed)
[place] Patient Name: Martin Hancock Procedure Date: 02/03/2019 11:56 AM MRN: [id number] Endoscopist: Milus Banister , MD Age: 44 Referring MD:  Date of Birth: 1975-11-24 Gender: Male Account #: [id number] Procedure:                Colonoscopy Indications:              Screening in patient at increased risk: Family                            history of 1st-degree relative with colorectal                            cancer; father diagnosed with colon cancer in his                            2s Medicines:                Monitored Anesthesia Care Procedure:                Pre-Anesthesia Assessment:                           - Prior to the procedure, a History and Physical                            was performed, and patient medications and                            allergies were reviewed. The patient's tolerance of                            previous anesthesia was also reviewed. The risks                            and benefits of the procedure and the sedation                            options and risks were discussed with the patient.                            All questions were answered, and informed consent                            was obtained. Prior Anticoagulants: The patient has                            taken no previous anticoagulant or antiplatelet                            agents. ASA Grade Assessment: II - A patient with                            mild systemic disease. After reviewing the risks  and benefits, the patient was deemed in                            satisfactory condition to undergo the procedure.                           After obtaining informed consent, the colonoscope                            was passed under direct vision. Throughout the                            procedure, the patient's blood pressure, pulse, and                            oxygen saturations were monitored continuously. The                  Colonoscope was introduced through the anus and                            advanced to the the cecum, identified by                            appendiceal orifice and ileocecal valve. The                            colonoscopy was performed without difficulty. The                            patient tolerated the procedure well. The quality                            of the bowel preparation was good. The ileocecal                            valve, appendiceal orifice, and rectum were                            photographed. Scope In: 12:01:22 PM Scope Out: 12:09:47 PM Scope Withdrawal Time: 0 hours 6 minutes 58 seconds  Total Procedure Duration: 0 hours 8 minutes 25 seconds  Findings:                 A 7 mm polyp was found in the cecum. The polyp was                            sessile. The polyp was removed with a cold snare.                            Resection and retrieval were complete.                           The exam was otherwise without abnormality on  direct and retroflexion views. Complications:            No immediate complications. Estimated blood loss:                            None. Estimated Blood Loss:     Estimated blood loss: none. Impression:               - One 7 mm polyp in the cecum, removed with a cold                            snare. Resected and retrieved.                           - The examination was otherwise normal on direct                            and retroflexion views. Recommendation:           - Patient has a contact number available for                            emergencies. The signs and symptoms of potential                            delayed complications were discussed with the                            patient. Return to normal activities tomorrow.                            Written discharge instructions were provided to the                            patient.                           - Resume  previous diet.                           - Continue present medications.                           - Await pathology results. Milus Banister, MD 02/03/2019 12:12:00 PM This report has been signed electronically.

## 2019-02-05 ENCOUNTER — Telehealth: Payer: Self-pay

## 2019-02-05 ENCOUNTER — Telehealth: Payer: Self-pay | Admitting: *Deleted

## 2019-02-05 NOTE — Telephone Encounter (Signed)
Attempted to reach patient for post-procedure f/u call. Unable to leave message as mailbox is full. Staff will attempt to reach him again later today.

## 2019-02-05 NOTE — Telephone Encounter (Signed)
Pt called stated he was doing fine.

## 2019-02-05 NOTE — Telephone Encounter (Signed)
No answer for post procedure call back and unable to leave message. SM 

## 2019-02-07 ENCOUNTER — Encounter: Payer: Self-pay | Admitting: Gastroenterology

## 2019-08-31 ENCOUNTER — Encounter (HOSPITAL_COMMUNITY): Payer: Self-pay | Admitting: Emergency Medicine

## 2019-08-31 ENCOUNTER — Emergency Department (HOSPITAL_COMMUNITY): Payer: BC Managed Care – PPO

## 2019-08-31 ENCOUNTER — Emergency Department (HOSPITAL_COMMUNITY)
Admission: EM | Admit: 2019-08-31 | Discharge: 2019-09-01 | Disposition: A | Discharge status: Home / Self Care | Payer: BC Managed Care – PPO | Attending: Emergency Medicine | Admitting: Emergency Medicine

## 2019-08-31 ENCOUNTER — Other Ambulatory Visit: Payer: Self-pay

## 2019-08-31 DIAGNOSIS — Y998 Other external cause status: Secondary | ICD-10-CM | POA: Insufficient documentation

## 2019-08-31 DIAGNOSIS — Y92838 Other recreation area as the place of occurrence of the external cause: Secondary | ICD-10-CM | POA: Diagnosis not present

## 2019-08-31 DIAGNOSIS — G44309 Post-traumatic headache, unspecified, not intractable: Secondary | ICD-10-CM | POA: Insufficient documentation

## 2019-08-31 DIAGNOSIS — Y9317 Activity, water skiing and wake boarding: Secondary | ICD-10-CM | POA: Diagnosis not present

## 2019-08-31 DIAGNOSIS — Z87891 Personal history of nicotine dependence: Secondary | ICD-10-CM | POA: Diagnosis not present

## 2019-08-31 DIAGNOSIS — H5371 Glare sensitivity: Secondary | ICD-10-CM | POA: Insufficient documentation

## 2019-08-31 DIAGNOSIS — F0781 Postconcussional syndrome: Secondary | ICD-10-CM | POA: Diagnosis not present

## 2019-08-31 DIAGNOSIS — S0990XA Unspecified injury of head, initial encounter: Secondary | ICD-10-CM

## 2019-08-31 LAB — CBC WITH DIFFERENTIAL/PLATELET
Abs Immature Granulocytes: 0.02 10*3/uL (ref 0.00–0.07)
Basophils Absolute: 0.1 10*3/uL (ref 0.0–0.1)
Basophils Relative: 1 %
Eosinophils Absolute: 0.3 10*3/uL (ref 0.0–0.5)
Eosinophils Relative: 4 %
HCT: 44.3 % (ref 39.0–52.0)
Hemoglobin: 15.2 g/dL (ref 13.0–17.0)
Immature Granulocytes: 0 %
Lymphocytes Relative: 19 %
Lymphs Abs: 1.6 10*3/uL (ref 0.7–4.0)
MCH: 31.8 pg (ref 26.0–34.0)
MCHC: 34.3 g/dL (ref 30.0–36.0)
MCV: 92.7 fL (ref 80.0–100.0)
Monocytes Absolute: 0.9 10*3/uL (ref 0.1–1.0)
Monocytes Relative: 11 %
Neutro Abs: 5.7 10*3/uL (ref 1.7–7.7)
Neutrophils Relative %: 65 %
Platelets: 207 10*3/uL (ref 150–400)
RBC: 4.78 MIL/uL (ref 4.22–5.81)
RDW: 11.8 % (ref 11.5–15.5)
WBC: 8.6 10*3/uL (ref 4.0–10.5)
nRBC: 0 % (ref 0.0–0.2)

## 2019-08-31 LAB — COMPREHENSIVE METABOLIC PANEL
ALT: 23 U/L (ref 0–44)
AST: 20 U/L (ref 15–41)
Albumin: 4.3 g/dL (ref 3.5–5.0)
Alkaline Phosphatase: 98 U/L (ref 38–126)
Anion gap: 12 (ref 5–15)
BUN: 13 mg/dL (ref 6–20)
CO2: 24 mmol/L (ref 22–32)
Calcium: 9.4 mg/dL (ref 8.9–10.3)
Chloride: 102 mmol/L (ref 98–111)
Creatinine, Ser: 0.92 mg/dL (ref 0.61–1.24)
GFR calc Af Amer: >60 mL/min (ref >60)
GFR calc non Af Amer: >60 mL/min (ref >60)
Glucose, Bld: 104 mg/dL — ABNORMAL HIGH (ref 70–99)
Potassium: 3.7 mmol/L (ref 3.5–5.1)
Sodium: 138 mmol/L (ref 135–145)
Total Bilirubin: 0.7 mg/dL (ref 0.3–1.2)
Total Protein: 7.1 g/dL (ref 6.5–8.1)

## 2019-08-31 NOTE — ED Triage Notes (Signed)
Pt's wife st's pt fell off a wake board today hitting his head on the board.  ? LOC    Wife st's pt has been c/o severe headache and has had problems walking.  Injury happened approx 1:30 today

## 2019-09-01 NOTE — ED Provider Notes (Signed)
[facility] EMERGENCY DEPARTMENT Provider Note   CSN: [id number] Arrival date & time: 08/31/19  2007     History Chief Complaint  Patient presents with  . Head Injury    Martin Hancock is a 44 y.o. male.  HPI   44 year old male presenting to the emergency department today for evaluation of a head injury.  Patient states that he was wakeboarding earlier today when he fell backwards hitting his head on the water.  States that he believes he lost consciousness for about a minute.  Following this he had a severe headache.  He also was having difficulty ambulating due to balance issues.  He further experienced some slurred speech.  He took some ibuprofen which did improve his symptoms and now his headache is largely resolved.  He also reports that his slurred speech and ataxia is improving as well.  He denies any visual changes but did have some photophobia earlier.  He denies any other injuries from the fall.  Denies any episodes of vomiting or unilateral numbness/weakness.  History reviewed. No pertinent past medical history.  Patient Active Problem List   Diagnosis Date Noted  . Vitamin D deficiency 12/23/2018  . Counseling on health promotion and disease prevention 12/23/2018  . Weight loss counseling, encounter for 12/23/2018  . Elevated fasting glucose 12/23/2018  . Chronic right shoulder pain 10/21/2018  . Right tennis elbow 10/21/2018  . h/o Carpal tunnel syndrome of right wrist 10/21/2018  . Impingement syndrome of right shoulder 10/21/2018  . Rotator cuff tendinitis, right 10/21/2018  . Overuse injury 10/21/2018  . Obesity (BMI 30-39.9) 2018/05/09  . Environmental and seasonal allergies 05/09/18  . History of smoking for 6-10 years 2018-05-09  . Family history of diabetes mellitus (DM) 05-09-2018  . Family history of uterine cancer- Mom 05/09/2018  . Family history of breast cancer- aunt 05-09-2018  . Family history of colon cancer in father- in  early 73's 2018-05-09  . Patient's father is deceased- pt. doesn't know his med hx other than died mid-60's.  2018/05/09  . History of back surgery 05-09-2018  . STREP THROAT 01/13/2009  . CONTACT DERMATITIS&OTHER ECZEMA DUE TO PLANTS 03/30/2008  . EPIDEMIC HEMORRHAGIC CONJUNCTIVITIS 02/09/2008  . h/o GERD 08/21/2007  . DIARRHEA 08/21/2007  . ABDOMINAL PAIN-RLQ 08/21/2007  . ABDOMINAL PAIN RIGHT UPPER QUADRANT 07/30/2007  . ROM 12/16/2006  . VIRAL URI 12/13/2006  . DIVERTICULOSIS, COLON 08/17/2006    Past Surgical History:  Procedure Laterality Date  . BACK SURGERY    . BACK SURGERY  2003  . TYMPANOSTOMY TUBE PLACEMENT         Family History  Problem Relation Age of Onset  . Colon cancer Father   . Liver cancer Father   . Diabetes Mother   . Rectal cancer Neg Hx   . Stomach cancer Neg Hx     Social History   Tobacco Use  . Smoking status: Former Smoker    Packs/day: 1.00    Years: 15.00    Pack years: 15.00    Quit date: 2011    Years since quitting: 10.6  . Smokeless tobacco: Never Used  Vaping Use  . Vaping Use: Never used  Substance Use Topics  . Alcohol use: Yes    Alcohol/week: 14.0 standard drinks    Types: 14 Standard drinks or equivalent per week  . Drug use: No    Home Medications Prior to Admission medications   Medication Sig Start Date End Date Taking? Authorizing  Provider  Cholecalciferol (VITAMIN D3) 125 MCG (5000 UT) TABS 5,000 IU OTC vitamin D3 daily. 12/23/18   Opalski, Neoma Laming, DO  gabapentin (NEURONTIN) 300 MG capsule Take 1 capsule (300 mg total) by mouth at bedtime. 05/02/18   Thurman Coyer, DO  ibuprofen (ADVIL) 200 MG tablet Take 200 mg by mouth 4 (four) times daily.    [provider]  meloxicam (MOBIC) 7.5 MG tablet Take 1-2 tablets (7.5-15 mg total) by mouth as needed for pain. 10/21/18   Opalski, Deborah, DO  naproxen (NAPROSYN) 500 MG tablet Take 1 tablet by mouth 2 (two) times daily. 04/07/18   [provider]   tiZANidine (ZANAFLEX) 4 MG capsule Take 1 capsule by mouth 2 (two) times daily as needed. 04/07/18   [provider]  Vitamin D, Ergocalciferol, (DRISDOL) 1.25 MG (50000 UT) CAPS capsule Take 1 capsule (50,000 Units total) by mouth every 7 (seven) days. 12/10/18   Mellody Dance, DO    Allergies    Patient has no known allergies.  Review of Systems   Review of Systems  Constitutional: Negative for fever.  HENT: Negative for nosebleeds.   Eyes: Positive for photophobia. Negative for visual disturbance.  Respiratory: Negative for shortness of breath.   Cardiovascular: Negative for chest pain.  Gastrointestinal: Negative for abdominal pain, nausea and vomiting.  Genitourinary: Negative for flank pain.  Musculoskeletal: Negative for back pain and neck pain.  Skin: Negative for wound.  Neurological: Positive for dizziness, light-headedness and headaches. Negative for weakness.       Head injury, +LOC  All other systems reviewed and are negative.   Physical Exam Updated Vital Signs BP 122/79   Pulse 73   Temp 97.6 F (36.4 C) (Oral)   Resp 15   Ht 6' (1.829 m)   Wt 118.8 kg   SpO2 100%   BMI 35.53 kg/m   Physical Exam Vitals and nursing note reviewed.  Constitutional:      Appearance: He is well-developed.  HENT:     Head: Normocephalic and atraumatic.  Eyes:     Conjunctiva/sclera: Conjunctivae normal.  Neck:     Vascular: No carotid bruit.  Cardiovascular:     Rate and Rhythm: Normal rate and regular rhythm.     Heart sounds: No murmur heard.   Pulmonary:     Effort: Pulmonary effort is normal. No respiratory distress.     Breath sounds: Normal breath sounds.  Abdominal:     General: Bowel sounds are normal.     Palpations: Abdomen is soft.     Tenderness: There is no abdominal tenderness.  Musculoskeletal:     Cervical back: Normal range of motion and neck supple. No tenderness.     Comments: No midline CTL spine TTP.  Skin:    General: Skin is warm  and dry.  Neurological:     Mental Status: He is alert.     Comments: Mental Status:  Alert, thought content appropriate, able to give a coherent history. Speech fluent without evidence of aphasia. Able to follow 2 step commands without difficulty.  Cranial Nerves:  II: pupils equal, round, reactive to light III,IV, VI: ptosis not present, extra-ocular motions intact bilaterally  V,VII: smile symmetric, facial light touch sensation equal VIII: hearing grossly normal to voice  X: uvula elevates symmetrically  XI: bilateral shoulder shrug symmetric and strong XII: midline tongue extension without fassiculations Motor:  Normal tone. 5/5 strength of BUE and BLE major muscle groups including strong and equal grip  strength and dorsiflexion/plantar flexion Sensory: light touch normal in all extremities. Cerebellar: normal finger-to-nose with bilateral upper extremities, normal heel to shin Gait: normal gait and balance      ED Results / Procedures / Treatments   Labs (all labs ordered are listed, but only abnormal results are displayed) Labs Reviewed  COMPREHENSIVE METABOLIC PANEL - Abnormal; Notable for the following components:      Result Value   Glucose, Bld 104 (*)    All other components within normal limits  CBC WITH DIFFERENTIAL/PLATELET    EKG None  Radiology CT Head Wo Contrast  Result Date: 08/31/2019 CLINICAL DATA:  Fall off wake board EXAM: CT HEAD WITHOUT CONTRAST TECHNIQUE: Contiguous axial images were obtained from the base of the skull through the vertex without intravenous contrast. COMPARISON:  None. FINDINGS: Brain: No evidence of acute territorial infarction, hemorrhage, hydrocephalus,extra-axial collection or mass lesion/mass effect. Normal gray-white differentiation. Ventricles are normal in size and contour. Vascular: No hyperdense vessel or unexpected calcification. Skull: The skull is intact. No fracture or focal lesion identified. Sinuses/Orbits: The  visualized paranasal sinuses and mastoid air cells are clear. The orbits and globes intact. Other: None IMPRESSION: No acute intracranial abnormality. Electronically Signed   By: Prudencio Pair M.D.   On: 08/31/2019 21:07    Procedures Procedures (including critical care time)  Medications Ordered in ED Medications - No data to display  ED Course  I have reviewed the triage vital signs and the nursing notes.  Pertinent labs & imaging results that were available during my care of the patient were reviewed by me and considered in my medical decision making (see chart for details).    MDM Rules/Calculators/A&P                          Patient with head injury which did cause short period of LOC.   No evidence of skull fracture on physical exam. Patient is not taking anticoagulants, is less than 65 and has no history of subarachnoid or subdural hemorrhage. Pt reports ataxia, headache, photophobia and some amnesia following the event.  Patient with no focal neurological deficits on physical exam.  Discussed symptoms to return to the emergency department. Discussed the likely etiology of patient's symptoms being concussive in nature.  CT scan head reviewed/interpreted and negative for any acute abnormalities. Labs benign.  Patient will be discharged with information pertaining to diagnosis and advised to use over-the-counter medications like NSAIDs and Tylenol for pain relief. Pt has also advised to not participate in contact sports until they are completely asymptomatic for at least 1 week or they are cleared by their doctor.   Final Clinical Impression(s) / ED Diagnoses Final diagnoses:  Injury of head, initial encounter  Post concussive syndrome    Rx / DC Orders ED Discharge Orders    None       Rodney Booze, PA-C 09/01/19 [place], [place], DO 09/01/19 [phone]

## 2019-09-01 NOTE — Discharge Instructions (Signed)
HEAD INJURY If any of the following occur notify your physician or go to the Hospital Emergency Department:  Increased drowsiness, stupor or loss of consciousness  Restlessness or convulsions (fits)  Paralysis in arms or legs  Temperature above 100 F  Vomiting  Severe headache  Blood or clear fluid dripping from the nose or ears  Stiffness of the neck  Dizziness or blurred vision  Pulsating pain in the eye  Unequal pupils of eye  Personality changes  Any other unusual symptoms PRECAUTIONS  Do not take tranquilizers, sedatives, narcotics or alcohol  Avoid aspirin. Use only acetaminophen (e.g. Tylenol) or ibuprofen (e.g. Advil) for relief of pain. Follow directions on the bottle for dosage.  Use ice packs for comfort  Getting plenty of rest and sleep helps the brain to heal. Do not try to do too much too fast. As you start to feel better, you can slowly and gradually return to your usual routine.  Avoid activities that are physically demanding (e.g., sports, heavy housecleaning, exercising) or require a lot of thinking or concentration (e.g., working on the computer, playing video games).  Ask your health care professional when you can safely drive a car, ride a bike, or operate heavy equipment.   MEDICATIONS Use medications only as directed by your physician  Follow up with your primary care provider or with the concussion clinic within 5-7 days for re-evaluation.

## 2019-09-04 ENCOUNTER — Telehealth: Payer: Self-pay | Admitting: Family Medicine

## 2019-09-04 NOTE — Telephone Encounter (Signed)
Called pt and scheduled him for new pt concussion visit w/ Dr. Georgina Snell for 09/10/19.  He reports con't symptoms of HA, light-headedness, sensitivity to light, fatigue and slowed processing.

## 2019-09-04 NOTE — Telephone Encounter (Signed)
Patient called stating that he was seen in the ED on Sunday for a concussion and was told to follow up with [place]. He mentioned that he left a message on the [facility] but has not received a call back.  Please advise.

## 2019-09-10 ENCOUNTER — Ambulatory Visit: Payer: BC Managed Care – PPO | Admitting: Family Medicine

## 2019-09-10 ENCOUNTER — Encounter: Payer: Self-pay | Admitting: Family Medicine

## 2019-09-10 ENCOUNTER — Other Ambulatory Visit: Payer: Self-pay

## 2019-09-10 VITALS — BP 92/68 | HR 80 | Ht 72.0 in | Wt 263.2 lb

## 2019-09-10 DIAGNOSIS — M62838 Other muscle spasm: Secondary | ICD-10-CM

## 2019-09-10 DIAGNOSIS — S060X9A Concussion with loss of consciousness of unspecified duration, initial encounter: Secondary | ICD-10-CM | POA: Diagnosis not present

## 2019-09-10 NOTE — Patient Instructions (Addendum)
Thank you for coming in today. Plan for advance activity as tolerated.  If dizzy is not better in a few weeks lets me know and I will refer to vestibular PT.   If neck is not better with time, heat and TENS unit again let me know. PT is the best treatment.   Recheck with me as needed.     Concussion, Adult  A concussion is a brain injury from a hard, direct hit (trauma) to the head or body. This direct hit causes the brain to shake quickly back and forth inside the skull. This can damage brain cells and cause chemical changes in the brain. A concussion may also be known as a mild traumatic brain injury (TBI). Concussions are usually not life-threatening, but the effects of a concussion can be serious. If you have a concussion, you should be very careful to avoid having a second concussion. What are the causes? This condition is caused by:  A direct hit to your head, such as: ? Running into another player during a game. ? Being hit in a fight. ? Hitting your head on a hard surface.  Sudden movement of your body that causes your brain to move back and forth inside the skull, such as in a car crash. What are the signs or symptoms? The signs of a concussion can be hard to notice. Early on, they may be missed by you, family members, and health care providers. You may look fine on the outside but may act or feel differently. Symptoms are usually temporary and most often improve in 7-10 days. Some symptoms appear right away, but other symptoms may not show up for hours or days. If your symptoms last longer than normal, you may have post-concussion syndrome. Every head injury is different. Physical symptoms  Headaches. This can include a feeling of pressure in the head or migraine-like symptoms.  Tiredness (fatigue).  Dizziness.  Problems with coordination or balance.  Vision or hearing problems.  Sensitivity to light or noise.  Nausea or vomiting.  Changes in eating or sleeping  patterns.  Numbness or tingling.  Seizure. Mental and emotional symptoms  Memory problems.  Trouble concentrating, organizing, or making decisions.  Slowness in thinking, acting or reacting, speaking, or reading.  Irritability or mood changes.  Anxiety or depression. How is this diagnosed? This condition is diagnosed based on:  Your symptoms.  A description of your injury. You may also have tests, including:  Imaging tests, such as a CT scan or MRI.  Neuropsychological tests. These measure your thinking, understanding, learning, and remembering abilities. How is this treated? Treatment for this condition includes:  Stopping sports or activity if you are injured. If you hit your head or show signs of concussion: ? Do not return to sports or activities the same day. ? Get checked by a health care provider before you return to your activities.  Physical and mental rest and careful observation, usually at home. Gradually return to your normal activities.  Medicines to help with symptoms such as headaches, nausea, or difficulty sleeping. ? Avoid taking opioid pain medicine while recovering from a concussion.  Avoiding alcohol and drugs. These may slow your recovery and can put you at risk of further injury.  Referral to a concussion clinic or rehabilitation center. Recovery from a concussion can take time. How fast you recover depends on many factors. Return to activities only when:  Your symptoms are completely gone.  Your health care provider says that it is  safe. Follow these instructions at home: Activity  Limit activities that require a lot of thought or concentration, such as: ? Doing homework or job-related work. ? Watching TV. ? Working on the computer or phone. ? Playing memory games and puzzles.  Rest. Rest helps your brain heal. Make sure you: ? Get plenty of sleep. Most adults should get 7-9 hours of sleep each night. ? Rest during the day. Take naps  or rest breaks when you feel tired.  Avoid physical activity like exercise until your health care provider says it is safe. Stop any activity that worsens symptoms.  Do not do high-risk activities that could cause a second concussion, such as riding a bike or playing sports.  Ask your health care provider when you can return to your normal activities, such as school, work, athletics, and driving. Your ability to react may be slower after a brain injury. Never do these activities if you are dizzy. Your health care provider will likely give you a plan for gradually returning to activities. General instructions   Take over-the-counter and prescription medicines only as told by your health care provider. Some medicines, such as blood thinners (anticoagulants) and aspirin, may increase the risk for complications, such as bleeding.  Do not drink alcohol until your health care provider says you can.  Watch your symptoms and tell others around you to do the same. Complications sometimes occur after a concussion. Older adults with a brain injury may have a higher risk of serious complications.  Tell your work Freight forwarder, teachers, Government social research officer, school counselor, coach, or Product/process development scientist about your injury, symptoms, and restrictions.  Keep all follow-up visits as told by your health care provider. This is important. How is this prevented? Avoiding another brain injury is very important. In rare cases, another injury can lead to permanent brain damage, brain swelling, or death. The risk of this is greatest during the first 7-10 days after a head injury. Avoid injuries by:  Stopping activities that could lead to a second concussion, such as contact or recreational sports, until your health care provider says it is okay.  Taking these actions once you have returned to sports or activities: ? Avoiding plays or moves that can cause you to crash into another person. This is how most concussions  occur. ? Following the rules and being respectful of other players. Do not engage in violent or illegal plays.  Getting regular exercise that includes strength and balance training.  Wearing a properly fitting helmet during sports, biking, or other activities. Helmets can help protect you from serious skull and brain injuries, but they do not protect you from a concussion. Even when wearing a helmet, you should avoid being hit in the head. Contact a health care provider if:  Your symptoms get worse or they do not improve.  You have new symptoms.  You have another injury. Get help right away if:  You have severe or worsening headaches.  You have weakness or numbness in any part of your body.  You are confused.  Your coordination gets worse.  You vomit repeatedly.  You are sleepier than normal.  Your speech is slurred.  You cannot recognize people or places.  You have a seizure.  It is difficult to wake you up.  You have unusual behavior changes.  You have changes in your vision.  You lose consciousness. Summary  A concussion is a brain injury that results from a hard, direct hit (trauma) to your head or  body.  You may have imaging tests and neuropsychological tests to diagnose a concussion.  Treatment for this condition includes physical and mental rest and careful observation.  Ask your health care provider when you can return to your normal activities, such as school, work, athletics, and driving.  Get help right away if you have a severe headache, weakness on one side of the body, seizures, behavior changes, changes in vision, or if you are confused or sleepier than normal. This information is not intended to replace advice given to you by your health care provider. Make sure you discuss any questions you have with your health care provider. Document Revised: 08/09/2017 Document Reviewed: 08/09/2017 Elsevier Patient Education  [place].

## 2019-09-10 NOTE — Progress Notes (Signed)
Subjective:    Chief Complaint: Blair Promise, LAT, ATC, am serving as scribe for Dr. Lynne Leader.  Martin Hancock,  is a 44 y.o. male who presents for evaluation of a head injury he sustained on 08/31/19 when he fell backward while wakeboarding and hit his head on the water.  He was seen at the [facility] ED on 08/31/19 and reported possible LOC and symptoms of HA, balance difficulty w/ gait, slurred speech and photophobia. Since then, pt notes that most of his symptoms have improved.  He con't to have a slight HA in the middle of his forehead.  He notes difficulty w/ processing and finding words toward the end of the day.  He works at home and typically uses a standing desk and noticed that he was unconsciously leaning and ended up needing to lower his desk so he could sit.  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  Injury date : 08/31/19 Visit #: 1   History of Present Illness:    Concussion Self-Reported Symptom Score Symptoms rated on a scale 1-6, in last 24 hours   Headache: 1    Nausea: 0  Dizziness: 1  Vomiting: 0  Balance Difficulty: 1   Trouble Falling Asleep: 0   Fatigue: 1  Sleep Less Than Usual: 0  Daytime Drowsiness: 1  Sleep More Than Usual: 0  Photophobia: 0  Phonophobia: 0  Irritability: 0  Sadness: 0  Numbness or Tingling: 0  Nervousness: 0  Feeling More Emotional: 0  Feeling Mentally Foggy: 1  Feeling Slowed Down: 1  Memory Problems: 0  Difficulty Concentrating: 1  Visual Problems: 0   Total # of Symptoms: 8/22 Total Symptom Score: 8/132 Previous Symptom Score: N/A   Neck Pain: Yes  Tinnitus: No  Review of Systems: No fevers or chills    Review of History: No prior concussion.  No history of migraines or balance issues.  Objective:    Physical Examination Vitals:   09/10/19 0800  BP: 92/68  Pulse: 80  SpO2: 97%   MSK: C-spine normal-appearing nontender midline.  Tender palpation left cervical paraspinal  musculature. Upper extremity is intact. Neuro: Alert and oriented.  Normal coordination and finger-nose-finger.  Balance intact to double leg and single leg stance.  Impaired to tandem stance mildly. Psych: Normal speech thought process and affect.   Xray: CT Head Wo Contrast  Result Date: 08/31/2019 CLINICAL DATA:  Fall off wake board EXAM: CT HEAD WITHOUT CONTRAST TECHNIQUE: Contiguous axial images were obtained from the base of the skull through the vertex without intravenous contrast. COMPARISON:  None. FINDINGS: Brain: No evidence of acute territorial infarction, hemorrhage, hydrocephalus,extra-axial collection or mass lesion/mass effect. Normal gray-white differentiation. Ventricles are normal in size and contour. Vascular: No hyperdense vessel or unexpected calcification. Skull: The skull is intact. No fracture or focal lesion identified. Sinuses/Orbits: The visualized paranasal sinuses and mastoid air cells are clear. The orbits and globes intact. Other: None IMPRESSION: No acute intracranial abnormality. Electronically Signed   By: Prudencio Pair M.D.   On: 08/31/2019 21:07   I, Lynne Leader, personally (independently) visualized and performed the interpretation of the images attached in this note. Agree with radiology over read.  No bleeding.    Assessment and Plan   44 y.o. male with concussion.  Concussion occurred about a week and a half ago.  He is already having significant improvement.  Main issues today are a little bit of impaired balance.  This should  improve on its own however if it does not after few weeks he will notify me and I will refer to vestibular physical therapy.  Also having a little bit of neck pain.  Again should improve with typical conservative management for neck muscle spasm including heat and TENS unit.  If not better he will let me know and I will refer to physical therapy.  Discussed activity progression.  Return to work activity and exercise as  tolerated.  Recheck back with me as needed.      Action/Discussion: Reviewed diagnosis, management options, expected outcomes, and the reasons for scheduled and emergent follow-up. Questions were adequately answered. Patient expressed verbal understanding and agreement with the following plan.     Patient Education:  Reviewed with patient the risks (i.e, a repeat concussion, post-concussion syndrome, second-impact syndrome) of returning to play prior to complete resolution, and thoroughly reviewed the signs and symptoms of concussion.Reviewed need for complete resolution of all symptoms, with rest AND exertion, prior to return to play.  Reviewed red flags for urgent medical evaluation: worsening symptoms, nausea/vomiting, intractable headache, musculoskeletal changes, focal neurological deficits.  [facility] Concussion Care Plan, which clearly outlines the plans stated above, was given to patient.   In addition to the time spent performing tests, I spent 30 min   Reviewed with patient the risks (i.e, a repeat concussion, post-concussion syndrome, second-impact syndrome) of returning to play prior to complete resolution, and thoroughly reviewed the signs and symptoms of      concussion. Reviewedf need for complete resolution of all symptoms, with rest AND exertion, prior to return to play.  Reviewed red flags for urgent medical evaluation: worsening symptoms, nausea/vomiting, intractable headache, musculoskeletal changes, focal neurological deficits.  [facility] Concussion Care Plan, which clearly outlines the plans stated above, was given to patient   After Visit Summary printed out and provided to patient as appropriate.  The above documentation has been reviewed and is accurate and complete Lynne Leader

## 2019-12-26 DIAGNOSIS — R197 Diarrhea, unspecified: Secondary | ICD-10-CM | POA: Diagnosis not present

## 2019-12-26 DIAGNOSIS — Z20822 Contact with and (suspected) exposure to covid-19: Secondary | ICD-10-CM | POA: Diagnosis not present

## 2020-01-02 DIAGNOSIS — Z20828 Contact with and (suspected) exposure to other viral communicable diseases: Secondary | ICD-10-CM | POA: Diagnosis not present

## 2020-07-19 DIAGNOSIS — J019 Acute sinusitis, unspecified: Secondary | ICD-10-CM | POA: Diagnosis not present

## 2020-09-24 DIAGNOSIS — H9192 Unspecified hearing loss, left ear: Secondary | ICD-10-CM | POA: Diagnosis not present

## 2020-09-24 DIAGNOSIS — H7292 Unspecified perforation of tympanic membrane, left ear: Secondary | ICD-10-CM | POA: Diagnosis not present

## 2021-01-11 DIAGNOSIS — H7292 Unspecified perforation of tympanic membrane, left ear: Secondary | ICD-10-CM | POA: Diagnosis not present

## 2021-03-07 ENCOUNTER — Other Ambulatory Visit: Payer: Self-pay

## 2021-03-07 ENCOUNTER — Observation Stay (HOSPITAL_COMMUNITY)
Admission: EM | Admit: 2021-03-07 | Discharge: 2021-03-08 | Disposition: A | Discharge status: Home / Self Care | Payer: BC Managed Care – PPO | Attending: Internal Medicine | Admitting: Internal Medicine

## 2021-03-07 ENCOUNTER — Emergency Department (HOSPITAL_COMMUNITY): Payer: BC Managed Care – PPO

## 2021-03-07 ENCOUNTER — Encounter (HOSPITAL_COMMUNITY): Payer: Self-pay

## 2021-03-07 ENCOUNTER — Ambulatory Visit (HOSPITAL_COMMUNITY)
Admission: EM | Admit: 2021-03-07 | Discharge: 2021-03-07 | Disposition: A | Discharge status: Home / Self Care | Payer: BC Managed Care – PPO | Attending: Internal Medicine | Admitting: Internal Medicine

## 2021-03-07 DIAGNOSIS — R61 Generalized hyperhidrosis: Secondary | ICD-10-CM | POA: Diagnosis not present

## 2021-03-07 DIAGNOSIS — Z8719 Personal history of other diseases of the digestive system: Secondary | ICD-10-CM

## 2021-03-07 DIAGNOSIS — K219 Gastro-esophageal reflux disease without esophagitis: Secondary | ICD-10-CM | POA: Diagnosis present

## 2021-03-07 DIAGNOSIS — Z87891 Personal history of nicotine dependence: Secondary | ICD-10-CM | POA: Diagnosis not present

## 2021-03-07 DIAGNOSIS — R0602 Shortness of breath: Secondary | ICD-10-CM | POA: Insufficient documentation

## 2021-03-07 DIAGNOSIS — R079 Chest pain, unspecified: Secondary | ICD-10-CM | POA: Diagnosis present

## 2021-03-07 DIAGNOSIS — R5383 Other fatigue: Secondary | ICD-10-CM | POA: Diagnosis not present

## 2021-03-07 DIAGNOSIS — R110 Nausea: Secondary | ICD-10-CM | POA: Diagnosis not present

## 2021-03-07 DIAGNOSIS — Z20822 Contact with and (suspected) exposure to covid-19: Secondary | ICD-10-CM | POA: Insufficient documentation

## 2021-03-07 DIAGNOSIS — R42 Dizziness and giddiness: Secondary | ICD-10-CM | POA: Diagnosis not present

## 2021-03-07 DIAGNOSIS — E559 Vitamin D deficiency, unspecified: Secondary | ICD-10-CM | POA: Diagnosis present

## 2021-03-07 DIAGNOSIS — R0789 Other chest pain: Principal | ICD-10-CM | POA: Insufficient documentation

## 2021-03-07 LAB — TROPONIN I (HIGH SENSITIVITY)
Troponin I (High Sensitivity): <2 ng/L (ref <18)
Troponin I (High Sensitivity): <2 ng/L (ref <18)

## 2021-03-07 LAB — CBC
HCT: 44.8 % (ref 39.0–52.0)
Hemoglobin: 15.3 g/dL (ref 13.0–17.0)
MCH: 31 pg (ref 26.0–34.0)
MCHC: 34.2 g/dL (ref 30.0–36.0)
MCV: 90.9 fL (ref 80.0–100.0)
Platelets: 220 10*3/uL (ref 150–400)
RBC: 4.93 MIL/uL (ref 4.22–5.81)
RDW: 11.8 % (ref 11.5–15.5)
WBC: 7.7 10*3/uL (ref 4.0–10.5)
nRBC: 0 % (ref 0.0–0.2)

## 2021-03-07 LAB — BASIC METABOLIC PANEL
Anion gap: 11 (ref 5–15)
BUN: 16 mg/dL (ref 6–20)
CO2: 23 mmol/L (ref 22–32)
Calcium: 9 mg/dL (ref 8.9–10.3)
Chloride: 102 mmol/L (ref 98–111)
Creatinine, Ser: 0.88 mg/dL (ref 0.61–1.24)
GFR, Estimated: >60 mL/min (ref >60)
Glucose, Bld: 86 mg/dL (ref 70–99)
Potassium: 3.7 mmol/L (ref 3.5–5.1)
Sodium: 136 mmol/L (ref 135–145)

## 2021-03-07 MED ORDER — ASPIRIN 325 MG PO TABS
325.0000 mg | ORAL_TABLET | Freq: Every day | ORAL | Status: DC
  Administered 2021-03-08: 325 mg via ORAL
  Filled 2021-03-07: qty 1

## 2021-03-07 MED ORDER — ALUM & MAG HYDROXIDE-SIMETH 200-200-20 MG/5ML PO SUSP
30.0000 mL | Freq: Once | ORAL | Status: AC
  Administered 2021-03-07: 30 mL via ORAL
  Filled 2021-03-07: qty 30

## 2021-03-07 MED ORDER — LIDOCAINE VISCOUS HCL 2 % MT SOLN
15.0000 mL | Freq: Once | OROMUCOSAL | Status: AC
  Administered 2021-03-07: 15 mL via ORAL
  Filled 2021-03-07: qty 15

## 2021-03-07 MED ORDER — FAMOTIDINE 20 MG PO TABS
20.0000 mg | ORAL_TABLET | Freq: Once | ORAL | Status: AC
  Administered 2021-03-07: 20 mg via ORAL
  Filled 2021-03-07: qty 1

## 2021-03-07 MED ORDER — ACETAMINOPHEN 325 MG PO TABS
650.0000 mg | ORAL_TABLET | Freq: Once | ORAL | Status: AC
  Administered 2021-03-07: 650 mg via ORAL
  Filled 2021-03-07: qty 2

## 2021-03-07 NOTE — ED Triage Notes (Incomplete)
Pt w CP, states it felt like indigestion yesterday, continued central pain today, associated SHOB, very tired. Seen at [facility], EKG unremarkable but sent to ED for bloodwork, r/t hypertension ?

## 2021-03-07 NOTE — ED Provider Notes (Signed)
Saw pt in triage, having central cp since yesterday. EKG NSR. Still feels off and dizzy. I asked pt to proceed to the ER for further eval ?  ?Barrett Henle, MD ?03/07/21 1643 ? ?

## 2021-03-07 NOTE — ED Provider Notes (Signed)
[facility] EMERGENCY DEPARTMENT Provider Note   CSN: [id number] Arrival date & time: 03/07/21  1647     History  Chief Complaint  Patient presents with   Chest Pain    Martin Hancock is a 46 y.o. male.  Martin Hancock is a 46 y.o. male with history of obesity, otherwise healthy, who presents to the emergency department for evaluation of chest pain.  Patient reports chest pain began yesterday when he was doing strenuous activity in his garage.  He reports it was a central chest pressure that was severe.  Pain was nonradiating but with it he reports he became diaphoretic, lightheaded and nauseous.  He reports that he tried to sit down and rest for a few minutes but chest pain was not improving so he went to lay down to go to bed.  He reports he thought it may be indigestion so took some Tums, pain resolved and he was able to go to sleep but when he woke up this morning he started to experience similar less severe pains in the chest once again and also reports just feeling extremely fatigued throughout the day and short of breath.  Symptoms worse with exertion.  Pain is not pleuritic.  Pain is sometimes worse with movement.  Some associated lightheadedness continued today as well.  No vomiting or abdominal pain.  No fevers or chills, no cough.  Patient reports no history of similar pains, no prior cardiac history and no significant family history.  No history of diabetes, hypertension or high cholesterol, patient was a prior smoker but stopped over 10 years ago.  Patient does report some alcohol use, but denies any other drug use.  Patient is very concerned as he has never experienced similar symptoms.  The history is provided by the patient and the spouse.      Home Medications Prior to Admission medications   Medication Sig Start Date End Date Taking? Authorizing Provider  ibuprofen (ADVIL) 200 MG tablet Take 400-800 mg by mouth every 6 (six) hours as needed for  headache or moderate pain.   Yes [provider]  melatonin 5 MG TABS Take 10 mg by mouth at bedtime as needed (sleep).   Yes [provider]  Cholecalciferol (VITAMIN D3) 125 MCG (5000 UT) TABS 5,000 IU OTC vitamin D3 daily. Patient not taking: Reported on 09/10/2019 12/23/18   Mellody Dance, DO      Allergies    Patient has no known allergies.    Review of Systems   Review of Systems  Constitutional:  Positive for diaphoresis and fatigue. Negative for chills and fever.  HENT: Negative.    Eyes:  Negative for visual disturbance.  Respiratory:  Positive for shortness of breath.   Cardiovascular:  Positive for chest pain. Negative for palpitations and leg swelling.  Gastrointestinal:  Positive for nausea. Negative for abdominal pain and vomiting.  Genitourinary:  Negative for dysuria and frequency.  Musculoskeletal:  Negative for arthralgias and myalgias.  Skin:  Negative for color change and rash.  Neurological:  Positive for light-headedness. Negative for dizziness and syncope.   Physical Exam Updated Vital Signs BP 126/85    Pulse 72    Temp 98.4 F (36.9 C) (Oral)    Resp 16    SpO2 100%  Physical Exam Vitals and nursing note reviewed.  Constitutional:      General: He is not in acute distress.    Appearance: Normal appearance. He is well-developed. He is obese.  He is not ill-appearing or diaphoretic.  HENT:     Head: Normocephalic and atraumatic.  Eyes:     General:        Right eye: No discharge.        Left eye: No discharge.     Pupils: Pupils are equal, round, and reactive to light.  Cardiovascular:     Rate and Rhythm: Normal rate and regular rhythm.     Pulses: Normal pulses.          Radial pulses are 2+ on the right side and 2+ on the left side.       Dorsalis pedis pulses are 2+ on the right side and 2+ on the left side.     Heart sounds: Normal heart sounds.  Pulmonary:     Effort: Pulmonary effort is normal. No respiratory distress.      Breath sounds: Normal breath sounds. No wheezing or rales.     Comments: Respirations equal and unlabored, patient able to speak in full sentences, lungs clear to auscultation bilaterally  Chest:     Chest wall: No tenderness.  Abdominal:     General: Bowel sounds are normal. There is no distension.     Palpations: Abdomen is soft. There is no mass.     Tenderness: There is no abdominal tenderness. There is no guarding.     Comments: Abdomen soft, nondistended, nontender to palpation in all quadrants without guarding or peritoneal signs  Musculoskeletal:        General: No deformity.     Cervical back: Neck supple.     Right lower leg: No tenderness. No edema.     Left lower leg: No tenderness. No edema.  Skin:    General: Skin is warm and dry.     Capillary Refill: Capillary refill takes less than 2 seconds.  Neurological:     Mental Status: He is alert and oriented to person, place, and time.     Coordination: Coordination normal.     Comments: Speech is clear, able to follow commands Moves extremities without ataxia, coordination intact  Psychiatric:        Mood and Affect: Mood normal.        Behavior: Behavior normal.    ED Results / Procedures / Treatments   Labs (all labs ordered are listed, but only abnormal results are displayed) Labs Reviewed  RESP PANEL BY RT-PCR (FLU A&B, COVID) [id number]  BASIC METABOLIC PANEL  CBC  HIV ANTIBODY (ROUTINE TESTING W REFLEX)  TROPONIN I (HIGH SENSITIVITY)  TROPONIN I (HIGH SENSITIVITY)    EKG EKG Interpretation  Date/Time:  Monday March 07 2021 17:07:51 EST Ventricular Rate:  78 PR Interval:  142 QRS Duration: 82 QT Interval:  354 QTC Calculation: 403 R Axis:   51 Text Interpretation: Normal sinus rhythm Normal ECG When compared with ECG of 11-Apr-2012 15:09, PREVIOUS ECG IS PRESENT Confirmed by Gerlene Fee [phone]) on 03/08/2021 12:02:27 AM  Radiology DG Chest 2 View  Result Date: 03/07/2021 CLINICAL DATA:  Chest pain.  EXAM: CHEST - 2 VIEW COMPARISON:  April 11, 2012. FINDINGS: The heart size and mediastinal contours are within normal limits. Both lungs are clear. The visualized skeletal structures are unremarkable. IMPRESSION: No active cardiopulmonary disease. Electronically Signed   By: Marijo Conception M.D.   On: 03/07/2021 17:40    Procedures Procedures    Medications Ordered in ED Medications  aspirin tablet 325 mg (has no administration in time range)  melatonin tablet  10 mg (has no administration in time range)  acetaminophen (TYLENOL) tablet 650 mg (has no administration in time range)  ondansetron (ZOFRAN) injection 4 mg (has no administration in time range)  enoxaparin (LOVENOX) injection 40 mg (has no administration in time range)  0.9 %  sodium chloride infusion (has no administration in time range)  alum & mag hydroxide-simeth (MAALOX/MYLANTA) 200-200-20 MG/5ML suspension 30 mL (has no administration in time range)    And  lidocaine (XYLOCAINE) 2 % viscous mouth solution 15 mL (has no administration in time range)  ALPRAZolam (XANAX) tablet 0.25 mg (has no administration in time range)  alum & mag hydroxide-simeth (MAALOX/MYLANTA) 200-200-20 MG/5ML suspension 30 mL (has no administration in time range)  magnesium hydroxide (MILK OF MAGNESIA) suspension 30 mL (has no administration in time range)  traZODone (DESYREL) tablet 25 mg (has no administration in time range)  morphine (PF) 2 MG/ML injection 2 mg (has no administration in time range)  nitroGLYCERIN (NITROSTAT) SL tablet 0.4 mg (has no administration in time range)  famotidine (PEPCID) tablet 20 mg (20 mg Oral Given 03/07/21 2328)  acetaminophen (TYLENOL) tablet 650 mg (650 mg Oral Given 03/07/21 2328)  alum & mag hydroxide-simeth (MAALOX/MYLANTA) 200-200-20 MG/5ML suspension 30 mL (30 mLs Oral Given 03/07/21 2329)    And  lidocaine (XYLOCAINE) 2 % viscous mouth solution 15 mL (15 mLs Oral Given 03/07/21 2329)    ED Course/ Medical Decision  Making/ A&P                            This patient presents to the ED for concern of chest pain, this involves an extensive number of treatment options, and is a complaint that carries with it a high risk of complications and morbidity.  The differential diagnosis includes ACS, pulmonary embolism, dissection, pneumothorax, pneumonia, arrhythmia, severe anemia, MSK, GERD, anxiety, abdominal process.    Co morbidities that complicate the patient evaluation  Obesity   Additional history obtained:  Additional history obtained from spouse at bedside External records from outside source obtained and reviewed including prior urgent care visits   Lab Tests:  I Ordered, and personally interpreted labs.  The pertinent results include: No leukocytosis, normal hemoglobin, no electrolyte derangements and normal renal function, troponin negative x2, negative COVID and flu testing   Imaging Studies ordered:  I ordered imaging studies including chest x-ray I independently visualized and interpreted imaging which showed no active cardiopulmonary disease I agree with the radiologist interpretation   Cardiac Monitoring:  The patient was maintained on a cardiac monitor.  I personally viewed and interpreted the cardiac monitored which showed an underlying rhythm of: Normal sinus rhythm   Medicines ordered and prescription drug management:  I ordered medication including aspirin, Pepcid, GI cocktail, Tylenol for chest pain Reevaluation of the patient after these medicines showed that the patient stayed the same I have reviewed the patients home medicines and have made adjustments as needed   Test Considered:  CT angio to evaluate for PE, but patient is PERC negative, story does not suggest PE    Consultations Obtained:  I requested consultation with the hospitalist for admission for chest pain rule out,  and discussed lab and imaging findings as well as pertinent plan -Dr. Sidney Ace will see  and admit the patient.   Problem List / ED Course:  Patient presents with 2 days of chest pain, some aspects of chest pain are quite concerning but other sound atypical  and raise concern for potential GERD or chest wall pain, patient's only significant risk factor is age and obesity.   Heart pathway score is 4. Patient's work-up is overall been reassuring and does not suggest active ischemia, but I had a shared decision-making discussion with patient and wife at bedside they are very concerned that this is out of the ordinary with them and would prefer to be admitted for more immediate evaluation of this pain. Given the enzymes are currently negative and EKG shows no ischemic changes we will hold off on cardiology consult at this time, medicine will admit and speak with cardiology service in the morning.    Disposition:  Admission for chest pain rule out           Final Clinical Impression(s) / ED Diagnoses Final diagnoses:  Central chest pain    Rx / DC Orders ED Discharge Orders     None         Janet Berlin 03/10/21 1306    Drenda Freeze, MD 03/11/21 [phone]

## 2021-03-07 NOTE — ED Triage Notes (Signed)
Pt states had center to lt sided chest pain while he was working on a car tightening bolts yesterday and felt nausea and SOB and laid down. States took tums last night and it helped. States today having same CP, SOB, and very tired. Pt in no acute distress at this time, speaking in complete sentences.  ?

## 2021-03-07 NOTE — ED Notes (Signed)
Patient is being discharged from the Urgent Care and sent to the Emergency Department via POV . Per Dr. Windy Carina, patient is in need of higher level of care due to need of further evaluation. Patient is aware and verbalizes understanding of plan of care.  ?Vitals:  ? 03/07/21 1630  ?BP: 137/76  ?Pulse: 86  ?Resp: 18  ?SpO2: 99%  ?  ?

## 2021-03-08 DIAGNOSIS — K219 Gastro-esophageal reflux disease without esophagitis: Secondary | ICD-10-CM | POA: Diagnosis not present

## 2021-03-08 DIAGNOSIS — R072 Precordial pain: Secondary | ICD-10-CM

## 2021-03-08 LAB — RESP PANEL BY RT-PCR (FLU A&B, COVID) ARPGX2
Influenza A by PCR: NEGATIVE
Influenza B by PCR: NEGATIVE
SARS Coronavirus 2 by RT PCR: NEGATIVE

## 2021-03-08 LAB — HIV ANTIBODY (ROUTINE TESTING W REFLEX): HIV Screen 4th Generation wRfx: NONREACTIVE

## 2021-03-08 MED ORDER — ASPIRIN EC 81 MG PO TBEC
81.0000 mg | DELAYED_RELEASE_TABLET | Freq: Every day | ORAL | Status: DC
Start: 2021-03-08 — End: 2021-03-08

## 2021-03-08 MED ORDER — ENOXAPARIN SODIUM 40 MG/0.4ML IJ SOSY
40.0000 mg | PREFILLED_SYRINGE | INTRAMUSCULAR | Status: DC
  Administered 2021-03-08: 40 mg via SUBCUTANEOUS
  Filled 2021-03-08: qty 0.4

## 2021-03-08 MED ORDER — NITROGLYCERIN 0.4 MG SL SUBL
0.4000 mg | SUBLINGUAL_TABLET | SUBLINGUAL | Status: DC | PRN
Start: 2021-03-08 — End: 2021-03-08

## 2021-03-08 MED ORDER — ALUM & MAG HYDROXIDE-SIMETH 200-200-20 MG/5ML PO SUSP: 30.0000 mL | ORAL | Status: DC | PRN

## 2021-03-08 MED ORDER — ACETAMINOPHEN 325 MG PO TABS: 650.0000 mg | ORAL_TABLET | ORAL | Status: DC | PRN

## 2021-03-08 MED ORDER — MORPHINE SULFATE (PF) 2 MG/ML IV SOLN: 2.0000 mg | INTRAVENOUS | Status: DC | PRN

## 2021-03-08 MED ORDER — SODIUM CHLORIDE 0.9 % IV SOLN: INTRAVENOUS | Status: DC

## 2021-03-08 MED ORDER — ASPIRIN 81 MG PO TBEC: 81.0000 mg | DELAYED_RELEASE_TABLET | Freq: Every day | ORAL | 11 refills | Status: AC

## 2021-03-08 MED ORDER — TRAZODONE HCL 50 MG PO TABS: 25.0000 mg | ORAL_TABLET | Freq: Every evening | ORAL | Status: DC | PRN

## 2021-03-08 MED ORDER — ONDANSETRON HCL 4 MG/2ML IJ SOLN: 4.0000 mg | Freq: Four times a day (QID) | INTRAMUSCULAR | Status: DC | PRN

## 2021-03-08 MED ORDER — ALPRAZOLAM 0.25 MG PO TABS: 0.2500 mg | ORAL_TABLET | Freq: Two times a day (BID) | ORAL | Status: DC | PRN

## 2021-03-08 MED ORDER — ALUM & MAG HYDROXIDE-SIMETH 200-200-20 MG/5ML PO SUSP: 30.0000 mL | Freq: Once | ORAL | Status: DC

## 2021-03-08 MED ORDER — MAGNESIUM HYDROXIDE 400 MG/5ML PO SUSP: 30.0000 mL | Freq: Every day | ORAL | Status: DC | PRN

## 2021-03-08 MED ORDER — LIDOCAINE VISCOUS HCL 2 % MT SOLN: 15.0000 mL | Freq: Once | OROMUCOSAL | Status: DC

## 2021-03-08 MED ORDER — MELATONIN 5 MG PO TABS
10.0000 mg | ORAL_TABLET | Freq: Every evening | ORAL | Status: DC | PRN
  Filled 2021-03-08: qty 2

## 2021-03-08 NOTE — Assessment & Plan Note (Addendum)
-   This is apparently resolved and he is not taking vitamin D anymore.Marland Kitchen ?

## 2021-03-08 NOTE — Assessment & Plan Note (Signed)
-   The patient will be admitted to observation cardiac telemetry bed. ?- We will follow serial troponins. ?- He will be placed on aspirin as well as as needed sublingual nitroglycerin and morphine sulfate for pain. ?- Cardiology consult to be obtained. ?- This can be called in AM. ?

## 2021-03-08 NOTE — Discharge Summary (Signed)
Martin Hancock OAC:[id number] DOB: 1975/02/08 DOA: 03/07/2021  PCP: Lorrene Reid, PA-C  Admit date: 03/07/2021 Discharge date: 03/08/2021  Admitted From: Home Disposition: Home  Recommendations for Outpatient Follow-up:  Follow up with PCP in 1 week Please obtain BMP/CBC in one week Please follow up cardiology on Thursday as scheduled     Discharge Condition:Stable CODE STATUS: Full Diet recommendation: Heart Healthy   Brief/Interim Summary: Per [facility]:Martin Hancock is a 46 y.o. Caucasian male with medical history significant for vitamin D deficiency and GERD, who presented to the emergency room with acute onset of central chest pain felt as a pressure and graded 10/10 in severity yesterday while he was working in his garage.  He went to his bed and laid down before it resolved.  EKG was nonischemic.  Troponins were negative.  He will follow-up with cardiology as outpatient for evaluation and possible stress test.  He is stable to be discharged home.       Discharge Diagnoses:  Principal Problem:   Chest pain Active Problems:   h/o GERD   Vitamin D deficiency    Discharge Instructions  Discharge Instructions     Call MD for:  severe uncontrolled pain   Complete by: As directed    Diet - low sodium heart healthy   Complete by: As directed    Discharge instructions   Complete by: As directed    F/u with cardiology on Thursday as scheduled   Increase activity slowly   Complete by: As directed       Allergies as of 03/08/2021   No Known Allergies      Medication List     STOP taking these medications    ibuprofen 200 MG tablet Commonly known as: ADVIL       TAKE these medications    aspirin 81 MG EC tablet Take 1 tablet (81 mg total) by mouth daily. Swallow whole.   melatonin 5 MG Tabs Take 10 mg by mouth at bedtime as needed (sleep).   Vitamin D3 125 MCG (5000 UT) Tabs 5,000 IU OTC vitamin D3 daily.        Follow-up Information      Werner Lean, MD Follow up.   Specialty: Cardiology Why: Follow-up with Cardiology scheduled for 03/10/2021 at 8:40am. Please arrive 15 minutes early for check-in. Contact information: [address] [place] [address] [phone]                No Known Allergies  Consultations:    Procedures/Studies: DG Chest 2 View  Result Date: 03/07/2021 CLINICAL DATA:  Chest pain. EXAM: CHEST - 2 VIEW COMPARISON:  April 11, 2012. FINDINGS: The heart size and mediastinal contours are within normal limits. Both lungs are clear. The visualized skeletal structures are unremarkable. IMPRESSION: No active cardiopulmonary disease. Electronically Signed   By: Marijo Conception M.D.   On: 03/07/2021 17:40      Subjective: No dizziness.  No shortness of breath.  Discharge Exam: Vitals:   03/08/21 0723 03/08/21 0900  BP:  116/75  Pulse:  66  Resp:  17  Temp: 98.2 F (36.8 C)   SpO2:  97%   Vitals:   03/08/21 0600 03/08/21 0645 03/08/21 0723 03/08/21 0900  BP: 112/66 115/71  116/75  Pulse: 73 68  66  Resp: '15 14  17  '$ Temp:   98.2 F (36.8 C)   TempSrc:   Oral   SpO2: 95% 96%  97%  General: Pt is alert, awake, not in acute distress Cardiovascular: RRR, S1/S2 +, no rubs, no gallops Respiratory: CTA bilaterally, no wheezing, no rhonchi Abdominal: Soft, NT, ND, bowel sounds + Extremities: no edema, no cyanosis    The results of significant diagnostics from this hospitalization (including imaging, microbiology, ancillary and laboratory) are listed below for reference.     Microbiology: Recent Results (from the past 240 hour(s))  Resp Panel by RT-PCR (Flu A&B, Covid) Nasopharyngeal Swab     Status: None   Collection Time: 03/08/21 12:14 AM   Specimen: Nasopharyngeal Swab; Nasopharyngeal(NP) swabs in vial transport medium  Result Value Ref Range Status   SARS Coronavirus 2 by RT PCR NEGATIVE NEGATIVE Final    Comment: (NOTE) SARS-CoV-2 target nucleic  acids are NOT DETECTED.  The SARS-CoV-2 RNA is generally detectable in upper respiratory specimens during the acute phase of infection. The lowest concentration of SARS-CoV-2 viral copies this assay can detect is 138 copies/mL. A negative result does not preclude SARS-Cov-2 infection and should not be used as the sole basis for treatment or other patient management decisions. A negative result may occur with  improper specimen collection/handling, submission of specimen other than nasopharyngeal swab, presence of viral mutation(s) within the areas targeted by this assay, and inadequate number of viral copies(<138 copies/mL). A negative result must be combined with clinical observations, patient history, and epidemiological information. The expected result is Negative.  Fact Sheet for Patients:  EntrepreneurPulse.com.au  Fact Sheet for Healthcare Providers:  IncredibleEmployment.be  This test is no t yet approved or cleared by the [place] FDA and  has been authorized for detection and/or diagnosis of SARS-CoV-2 by FDA under an Emergency Use Authorization (EUA). This EUA will remain  in effect (meaning this test can be used) for the duration of the COVID-19 declaration under Section 564(b)(1) of the Act, 21 U.S.C.section 360bbb-3(b)(1), unless the authorization is terminated  or revoked sooner.       Influenza A by PCR NEGATIVE NEGATIVE Final   Influenza B by PCR NEGATIVE NEGATIVE Final    Comment: (NOTE) The Xpert Xpress SARS-CoV-2/FLU/RSV plus assay is intended as an aid in the diagnosis of influenza from Nasopharyngeal swab specimens and should not be used as a sole basis for treatment. Nasal washings and aspirates are unacceptable for Xpert Xpress SARS-CoV-2/FLU/RSV testing.  Fact Sheet for Patients: EntrepreneurPulse.com.au  Fact Sheet for Healthcare Providers: IncredibleEmployment.be  This  test is not yet approved or cleared by the [place] FDA and has been authorized for detection and/or diagnosis of SARS-CoV-2 by FDA under an Emergency Use Authorization (EUA). This EUA will remain in effect (meaning this test can be used) for the duration of the COVID-19 declaration under Section 564(b)(1) of the Act, 21 U.S.C. section 360bbb-3(b)(1), unless the authorization is terminated or revoked.  Performed at [facility], [place] [address] [place], [place] [address]      Labs: BNP (last 3 results) No results for input(s): BNP in the last 8760 hours. Basic Metabolic Panel: Recent Labs  Lab 03/07/21 1711  NA 136  K 3.7  CL 102  CO2 23  GLUCOSE 86  BUN 16  CREATININE 0.88  CALCIUM 9.0   Liver Function Tests: No results for input(s): AST, ALT, ALKPHOS, BILITOT, PROT, ALBUMIN in the last 168 hours. No results for input(s): LIPASE, AMYLASE in the last 168 hours. No results for input(s): AMMONIA in the last 168 hours. CBC: Recent Labs  Lab 03/07/21 1711  WBC 7.7  HGB 15.3  HCT 44.8  MCV 90.9  PLT 220   Cardiac Enzymes: No results for input(s): CKTOTAL, CKMB, CKMBINDEX, TROPONINI in the last 168 hours. BNP: Invalid input(s): POCBNP CBG: No results for input(s): GLUCAP in the last 168 hours. D-Dimer No results for input(s): DDIMER in the last 72 hours. Hgb A1c No results for input(s): HGBA1C in the last 72 hours. Lipid Profile No results for input(s): CHOL, HDL, LDLCALC, TRIG, CHOLHDL, LDLDIRECT in the last 72 hours. Thyroid function studies No results for input(s): TSH, T4TOTAL, T3FREE, THYROIDAB in the last 72 hours.  Invalid input(s): FREET3 Anemia work up No results for input(s): VITAMINB12, FOLATE, FERRITIN, TIBC, IRON, RETICCTPCT in the last 72 hours. Urinalysis No results found for: COLORURINE, APPEARANCEUR, [place], [place], GLUCOSEU, [place], [place], [place], PROTEINUR, UROBILINOGEN, NITRITE, LEUKOCYTESUR Sepsis Labs Invalid  input(s): PROCALCITONIN,  WBC,  LACTICIDVEN Microbiology Recent Results (from the past 240 hour(s))  Resp Panel by RT-PCR (Flu A&B, Covid) Nasopharyngeal Swab     Status: None   Collection Time: 03/08/21 12:14 AM   Specimen: Nasopharyngeal Swab; Nasopharyngeal(NP) swabs in vial transport medium  Result Value Ref Range Status   SARS Coronavirus 2 by RT PCR NEGATIVE NEGATIVE Final    Comment: (NOTE) SARS-CoV-2 target nucleic acids are NOT DETECTED.  The SARS-CoV-2 RNA is generally detectable in upper respiratory specimens during the acute phase of infection. The lowest concentration of SARS-CoV-2 viral copies this assay can detect is 138 copies/mL. A negative result does not preclude SARS-Cov-2 infection and should not be used as the sole basis for treatment or other patient management decisions. A negative result may occur with  improper specimen collection/handling, submission of specimen other than nasopharyngeal swab, presence of viral mutation(s) within the areas targeted by this assay, and inadequate number of viral copies(<138 copies/mL). A negative result must be combined with clinical observations, patient history, and epidemiological information. The expected result is Negative.  Fact Sheet for Patients:  EntrepreneurPulse.com.au  Fact Sheet for Healthcare Providers:  IncredibleEmployment.be  This test is no t yet approved or cleared by the [place] FDA and  has been authorized for detection and/or diagnosis of SARS-CoV-2 by FDA under an Emergency Use Authorization (EUA). This EUA will remain  in effect (meaning this test can be used) for the duration of the COVID-19 declaration under Section 564(b)(1) of the Act, 21 U.S.C.section 360bbb-3(b)(1), unless the authorization is terminated  or revoked sooner.       Influenza A by PCR NEGATIVE NEGATIVE Final   Influenza B by PCR NEGATIVE NEGATIVE Final    Comment: (NOTE) The Xpert  Xpress SARS-CoV-2/FLU/RSV plus assay is intended as an aid in the diagnosis of influenza from Nasopharyngeal swab specimens and should not be used as a sole basis for treatment. Nasal washings and aspirates are unacceptable for Xpert Xpress SARS-CoV-2/FLU/RSV testing.  Fact Sheet for Patients: EntrepreneurPulse.com.au  Fact Sheet for Healthcare Providers: IncredibleEmployment.be  This test is not yet approved or cleared by the [place] FDA and has been authorized for detection and/or diagnosis of SARS-CoV-2 by FDA under an Emergency Use Authorization (EUA). This EUA will remain in effect (meaning this test can be used) for the duration of the COVID-19 declaration under Section 564(b)(1) of the Act, 21 U.S.C. section 360bbb-3(b)(1), unless the authorization is terminated or revoked.  Performed at [facility], [place] [address], [place], [place] [address]      Time coordinating discharge: Over 30 minutes  SIGNED:   Nolberto Hanlon, MD  [facility] 03/08/2021, 11:14 AM Pager   If 7PM-7AM, please contact night-coverage [email or web address] Password [email or web address]

## 2021-03-08 NOTE — H&P (Signed)
?  ?  ?[place] ? ? ?PATIENT NAME: Martin Hancock   ? ?MR#:  [id number] ? ?DATE OF BIRTH:  09-04-1975 ? ?DATE OF ADMISSION:  03/07/2021 ? ?PRIMARY CARE PHYSICIAN: Lorrene Reid, PA-C  ? ?Patient is coming from: Home ? ?REQUESTING/REFERRING PHYSICIAN: Jacqlyn Larsen, PA-C ? ?CHIEF COMPLAINT:  ? ?Chief Complaint  ?Patient presents with  ? Chest Pain  ? ? ?HISTORY OF PRESENT ILLNESS:  ?Martin Hancock is a 46 y.o. Caucasian male with medical history significant for vitamin D deficiency and GERD, who presented to the emergency room with acute onset of central chest pain felt as a pressure and graded 10/10 in severity yesterday while he was working in his garage.  He went to his bed and laid down before it resolved.  Today he felt chest tightness that he graded 3-4/10 in severity which has been persistent with associated dyspnea and significant fatigue.  He admitted to associated nausea without vomiting as well as lightheadedness and dizziness.  He stated that he went to run errands and after that he was significantly fatigued.  He was then seen in urgent care and EKG was unremarkable but was sent to the emergency room for further assessment.  No cough or wheezing or hemoptysis.  No leg pain or edema or recent travels or surgeries.  No fever or chills.  No bleeding diathesis.  No dysuria, oliguria or hematuria or flank pain. ? ?ED Course: Upon presentation to the ER, vital signs were within normal.  Labs revealed unremarkable BMP.  CBC was within normal.  High-sensitivity troponin I was less than 2 twice. ?EKG as reviewed by me : EKG showed normal sinus rhythm with a rate of 74 with poor R wave progression ?Imaging: 2 view chest x-ray showed no acute cardiopulmonary disease. ? ?He was given 650 mg p.o. Tylenol, GI cocktail, 20 mg of IV Pepcid and 325 mg of p.o. aspirin.  He will be admitted to an observation cardiac telemetry bed for further evaluation and management. ? ?PAST MEDICAL HISTORY:  ?-Vitamin D  deficiency. ? ?PAST SURGICAL HISTORY:  ? ?Past Surgical History:  ?Procedure Laterality Date  ? BACK SURGERY    ? BACK SURGERY  2003  ? TYMPANOSTOMY TUBE PLACEMENT    ? ? ?SOCIAL HISTORY:  ? ?Social History  ? ?Tobacco Use  ? Smoking status: Former  ?  Packs/day: 1.00  ?  Years: 15.00  ?  Pack years: 15.00  ?  Types: Cigarettes  ?  Quit date: 2011  ?  Years since quitting: 12.1  ? Smokeless tobacco: Never  ?Substance Use Topics  ? Alcohol use: Yes  ?  Alcohol/week: 14.0 standard drinks  ?  Types: 14 Standard drinks or equivalent per week  ? ? ?FAMILY HISTORY:  ? ?Family History  ?Problem Relation Age of Onset  ? Colon cancer Father   ? Liver cancer Father   ? Diabetes Mother   ? Rectal cancer Neg Hx   ? Stomach cancer Neg Hx   ? ? ?DRUG ALLERGIES:  ?No Known Allergies ? ?REVIEW OF SYSTEMS:  ? ?ROS ?As per history of present illness. All pertinent systems were reviewed above. Constitutional, HEENT, cardiovascular, respiratory, GI, GU, musculoskeletal, neuro, psychiatric, endocrine, integumentary and hematologic systems were reviewed and are otherwise negative/unremarkable except for positive findings mentioned above in the HPI. ? ? ?MEDICATIONS AT HOME:  ? ?Prior to Admission medications   ?Medication Sig Start Date End Date Taking? Authorizing Provider  ?ibuprofen (ADVIL) 200 MG  tablet Take 400-800 mg by mouth every 6 (six) hours as needed for headache or moderate pain.   Yes [provider]  ?melatonin 5 MG TABS Take 10 mg by mouth at bedtime as needed (sleep).   Yes [provider]  ?Cholecalciferol (VITAMIN D3) 125 MCG (5000 UT) TABS 5,000 IU OTC vitamin D3 daily. ?Patient not taking: Reported on 09/10/2019 12/23/18   Mellody Dance, DO  ? ?  ? ?VITAL SIGNS:  ?Blood pressure 125/68, pulse 75, temperature 98.4 ?F (36.9 ?C), temperature source Oral, resp. rate (!) 28, SpO2 97 %. ? ?PHYSICAL EXAMINATION:  ?Physical Exam ? ?GENERAL:  46 y.o.-year-old Caucasian male patient lying in the bed with no  acute distress.  ?EYES: Pupils equal, round, reactive to light and accommodation. No scleral icterus. Extraocular muscles intact.  ?HEENT: Head atraumatic, normocephalic. Oropharynx and nasopharynx clear.  ?NECK:  Supple, no jugular venous distention. No thyroid enlargement, no tenderness.  ?LUNGS: Normal breath sounds bilaterally, no wheezing, rales,rhonchi or crepitation. No use of accessory muscles of respiration.  ?CARDIOVASCULAR: Regular rate and rhythm, S1, S2 normal. No murmurs, rubs, or gallops.  ?ABDOMEN: Soft, nondistended, nontender. Bowel sounds present. No organomegaly or mass.  ?EXTREMITIES: No pedal edema, cyanosis, or clubbing.  ?NEUROLOGIC: Cranial nerves II through XII are intact. Muscle strength 5/5 in all extremities. Sensation intact. Gait not checked.  ?PSYCHIATRIC: The patient is alert and oriented x 3.  Normal affect and good eye contact. ?SKIN: No obvious rash, lesion, or ulcer.  ? ?LABORATORY PANEL:  ? ?CBC ?Recent Labs  ?Lab 03/07/21 ?1711  ?WBC 7.7  ?HGB 15.3  ?HCT 44.8  ?PLT 220  ? ?------------------------------------------------------------------------------------------------------------------ ? ?Chemistries  ?Recent Labs  ?Lab 03/07/21 ?1711  ?NA 136  ?K 3.7  ?CL 102  ?CO2 23  ?GLUCOSE 86  ?BUN 16  ?CREATININE 0.88  ?CALCIUM 9.0  ? ?------------------------------------------------------------------------------------------------------------------ ? ?Cardiac Enzymes ?No results for input(s): TROPONINI in the last 168 hours. ?------------------------------------------------------------------------------------------------------------------ ? ?RADIOLOGY:  ?DG Chest 2 View ? ?Result Date: 03/07/2021 ?CLINICAL DATA:  Chest pain. EXAM: CHEST - 2 VIEW COMPARISON:  April 11, 2012. FINDINGS: The heart size and mediastinal contours are within normal limits. Both lungs are clear. The visualized skeletal structures are unremarkable. IMPRESSION: No active cardiopulmonary disease. Electronically Signed    By: Marijo Conception M.D.   On: 03/07/2021 17:40   ? ? ? ?IMPRESSION AND PLAN:  ?Assessment and Plan: ?* Chest pain ?- The patient will be admitted to observation cardiac telemetry bed. ?- We will follow serial troponins. ?- He will be placed on aspirin as well as as needed sublingual nitroglycerin and morphine sulfate for pain. ?- Cardiology consult to be obtained. ?- This can be called in AM. ? ?h/o GERD ?- We will place him on PPI therapy with Protonix at this would be in the differential diagnosis of his chest pain. ? ?Vitamin D deficiency ?- This is apparently resolved and he is not taking vitamin D anymore.. ? ? ?DVT prophylaxis: Lovenox.  ?Advanced Care Planning:  Code Status: full code.  ?Family Communication:  The plan of care was discussed in details with the patient (and family). ?I answered all questions. The patient agreed to proceed with the above mentioned plan. Further management will depend upon hospital course. ?Disposition Plan: Back to previous home environment ?Consults called: Cardiology consult can be obtained in a.m. ?All the records are reviewed and case discussed with ED provider. ? ?Status is: Observation ? ?I certify that at the time of admission,  it is my clinical judgment that the patient will require  hospital care extending less than 2 midnights. ?              ?             Dispo: The patient is from: Home ?             Anticipated d/c is to: Home ?             Patient currently is not medically stable to d/c. ?             Difficult to place patient: No ? ?Christel Mormon M.D on 03/08/2021 at 12:50 AM ? ?[facility]  ? ?From 7 PM-7 AM, contact night-coverage ?[email or web address] ? ?CC: Primary care physician; Lorrene Reid, PA-C ? ?

## 2021-03-08 NOTE — Assessment & Plan Note (Signed)
-   We will place him on PPI therapy with Protonix at this would be in the differential diagnosis of his chest pain. ?

## 2021-03-10 ENCOUNTER — Ambulatory Visit: Payer: BC Managed Care – PPO | Admitting: Internal Medicine

## 2021-03-10 ENCOUNTER — Other Ambulatory Visit: Payer: Self-pay

## 2021-03-10 ENCOUNTER — Encounter: Payer: Self-pay | Admitting: Internal Medicine

## 2021-03-10 VITALS — BP 114/68 | HR 85 | Ht 72.0 in | Wt 272.4 lb

## 2021-03-10 DIAGNOSIS — R079 Chest pain, unspecified: Secondary | ICD-10-CM | POA: Diagnosis not present

## 2021-03-10 NOTE — Patient Instructions (Signed)
Medication Instructions:  ?Your physician recommends that you continue on your current medications as directed. Please refer to the Current Medication list given to you today. ? ?*If you need a refill on your cardiac medications before your next appointment, please call your pharmacy* ? ? ?Lab Work: ?NONE ?If you have labs (blood work) drawn today and your tests are completely normal, you will receive your results only by: ?MyChart Message (if you have MyChart) OR ?A paper copy in the mail ?If you have any lab test that is abnormal or we need to change your treatment, we will call you to review the results. ? ? ?Testing/Procedures: ?Your physician has requested that you have en exercise stress myoview. For further information please visit [email or web address]. Please follow instruction sheet, listed below.  ? ?You are scheduled for a Myocardial Perfusion Imaging Study. ?Please arrive 15 minutes prior to your appointment time for registration and insurance purposes. ?  ?The test will take approximately 3 to 4 hours to complete; you may bring reading material.  If someone comes with you to your appointment, they will need to remain in the main lobby due to limited space in the testing area.  ?  ?How to prepare for your Myocardial Perfusion Test: ?Do not eat or drink 3 hours prior to your test, except you may have water. ?Do not consume products containing caffeine (regular or decaffeinated) 12 hours prior to your test. (ex: coffee, chocolate, sodas, tea). ?Do bring a list of your current medications with you.  If not listed below, you may take your medications as normal. ?Do wear comfortable clothes (no dresses or overalls) and walking shoes, tennis shoes preferred (No heels or open toe shoes are allowed). ?Do NOT wear cologne, perfume, aftershave, or lotions (deodorant is allowed). ?If these instructions are not followed, your test will have to be rescheduled. ? ?If you cannot keep your appointment, please provide 24  hours notification to the Nuclear Lab, to avoid a possible $50 charge to your account.  ?   ? ? ?Follow-Up: ?At [facility], you and your health needs are our priority.  As part of our continuing mission to provide you with exceptional heart care, we have created designated Provider Care Teams.  These Care Teams include your primary Cardiologist (physician) and Advanced Practice Providers (APPs -  Physician Assistants and Nurse Practitioners) who all work together to provide you with the care you need, when you need it. ? ?Your next appointment:   ?4 month(s) ? ?The format for your next appointment:   ?In Person ? ?Provider:   ?Rudean Haskell, MD  or Melina Copa, PA-C or Ermalinda Barrios, PA-C      ? ?

## 2021-03-10 NOTE — Progress Notes (Signed)
?Cardiology Office Note:   ? ?Date:  03/10/2021  ? ?ID:  Martin Hancock, DOB [birth year], MRN [id number] ? ?PCP:  Lorrene Reid, PA-C ?  ?[facility] Providers ?Cardiologist:  None    ? ?Referring MD: Lorrene Reid, PA-C  ? ?CC: chest pain ?Consulted for the evaluation of chest pain at the behest of [place]ok, [place], PA-C ? ?History of Present Illness:   ? ?Martin Hancock is a 46 y.o. male with a hx of significant alcohol use who presents for evaluation of chest pain. ? ?Patient notes that he is feeling new chest tightness.  First started having chest tightness Sunday afternoon.  Chest tightness coming on during stress; last was coming on coming back to work.  Felt some walking up to the visit today.  Felt a centralized pain that was constant in the center of his chest that radiates to his right arm and right leg.  Also felt nauseous. In the past couple of months has had some new sleeping issues and started melatonin.  Walking up more frequently in the middle of the night.  Patient exertion notable for playing basketball with the kids with  DOE but no CP.  Has worked from home from the last 1.5 years and has had decreased activity.   No PND or orthopnea.  Notes that he lost a weight but has gained some back  No syncope or near syncope. Notes  no palpitations or funny heart beats.    ? ?Mayn years ago, back in living in [place] had CP but this was different than this.  Has some chest pain and shortness of breath in December after a company Christmas party.  He recently has a friend that has chest pain.  Has not had a drink in 3 weeks and there is no correlation to chest pain. ? ?Had ED visit earlier this weak with no emergent findings.  Was discharged with cardiology follow up. ? ? ?Past Medical History:  ?Diagnosis Date  ? Chest pain   ? Concussion with loss of consciousness   ? Family history of colon cancer   ? Folliculitis   ? Infected cyst of skin   ? Obesity   ? Otalgia of right ear   ? Sinusitis   ?  Tympanic membrane perforation   ? ? ?Past Surgical History:  ?Procedure Laterality Date  ? BACK SURGERY    ? BACK SURGERY  2003  ? TYMPANOSTOMY TUBE PLACEMENT    ? ? ?Current Medications: ?Current Meds  ?Medication Sig  ? aspirin EC 81 MG EC tablet Take 1 tablet (81 mg total) by mouth daily. Swallow whole.  ? melatonin 5 MG TABS Take 10 mg by mouth at bedtime as needed (sleep).  ?  ? ?Allergies:   Patient has no known allergies.  ? ?Social History  ? ?Socioeconomic History  ? Marital status: Married  ?  Spouse name: Not on file  ? Number of children: Not on file  ? Years of education: Not on file  ? Highest education level: Not on file  ?Occupational History  ? Not on file  ?Tobacco Use  ? Smoking status: Former  ?  Packs/day: 1.00  ?  Years: 15.00  ?  Pack years: 15.00  ?  Types: Cigarettes  ?  Quit date: 2011  ?  Years since quitting: 12.1  ? Smokeless tobacco: Never  ?Vaping Use  ? Vaping Use: Never used  ?Substance and Sexual Activity  ? Alcohol use: Yes  ?  Alcohol/week: 14.0 standard drinks  ?  Types: 14 Standard drinks or equivalent per week  ? Drug use: No  ? Sexual activity: Yes  ?  Birth control/protection: None  ?Other Topics Concern  ? Not on file  ?Social History Narrative  ? Not on file  ? ?Social Determinants of Health  ? ?Financial Resource Strain: Not on file  ?Food Insecurity: Not on file  ?Transportation Needs: Not on file  ?Physical Activity: Not on file  ?Stress: Not on file  ?Social Connections: Not on file  ?  ?Social: Formerly from [place], comes with wife, and kids and plays basketball with them ? ?Family History: ?The patient's family history includes Colon cancer in his father; Diabetes in his mother; Liver cancer in his father. There is no history of Rectal cancer or Stomach cancer. ? ?ROS:   ?Please see the history of present illness.    ? All other systems reviewed and are negative. ? ?EKGs/Labs/Other Studies Reviewed:   ? ?The following studies were reviewed today: ? ?EKG:  EKG is   ordered today.  The ekg ordered today demonstrates  ?03/10/21: SR rate 82 WNL ? ?Recent Labs: ?03/07/2021: BUN 16; Creatinine, Ser 0.88; Hemoglobin 15.3; Platelets 220; Potassium 3.7; Sodium 136  ?Recent Lipid Panel ?   ?Component Value Date/Time  ? CHOL 160 12/09/2018 0844  ? TRIG 74 12/09/2018 0844  ? HDL 53 12/09/2018 0844  ? CHOLHDL 3.0 12/09/2018 0844  ? [place] 93 12/09/2018 0844  ? ? ?Physical Exam:   ? ?VS:  BP 114/68   Pulse 85   Ht 6' (1.829 m)   Wt 272 lb 6.4 oz (123.6 kg)   SpO2 96%   BMI 36.94 kg/m?    ? ?Wt Readings from Last 3 Encounters:  ?03/10/21 272 lb 6.4 oz (123.6 kg)  ?09/10/19 263 lb 3.2 oz (119.4 kg)  ?08/31/19 262 lb (118.8 kg)  ?  ? ?Gen: no distress, obese   ?Neck: No JVD, no carotid bruit ?Ears: no Pilar Plate Sign ?Cardiac: No Rubs or Gallops, no Murmur, RRR +2 radial pulses ?Respiratory: Clear to auscultation bilaterally, normal effort, normal  respiratory rate ?GI: Soft, nontender, non-distended  ?MS: No  edema;  moves all extremities ?Integument: Skin feels warm ?Neuro:  At time of evaluation, alert and oriented to person/place/time/situation  ?Psych: Normal affect, patient feels well ? ? ?ASSESSMENT:   ? ?No diagnosis found. ?PLAN:   ? ?Atypical, Persistent Chest Pain ?DOE ?Former Smoker ?- Will get exercise NM stress test- given stress being a key trigger to his chest pain and potentially his sleep  ?- continue ASA for now, may be reasonable to stop if no evidence of cardiac CP ? ? ?   ? ?Shared Decision Making/Informed Consent ?The risks [chest pain, shortness of breath, cardiac arrhythmias, dizziness, blood pressure fluctuations, myocardial infarction, stroke/transient ischemic attack, nausea, vomiting, allergic reaction, radiation exposure, metallic taste sensation and life-threatening complications (estimated to be 1 in 10,000)], benefits (risk stratification, diagnosing coronary artery disease, treatment guidance) and alternatives of a nuclear stress test were discussed in detail  with Mr. Thier and he agrees to proceed.  ? ? ?Medication Adjustments/Labs and Tests Ordered: ?Current medicines are reviewed at length with the patient today.  Concerns regarding medicines are outlined above.  ?No orders of the defined types were placed in this encounter. ? ?No orders of the defined types were placed in this encounter. ? ? ?There are no Patient Instructions on file for this visit.  ? ?Signed, ?  Werner Lean, MD  ?03/10/2021 8:54 AM    ?[place] ? ?

## 2021-03-14 ENCOUNTER — Telehealth (HOSPITAL_COMMUNITY): Payer: Self-pay | Admitting: *Deleted

## 2021-03-14 ENCOUNTER — Encounter (HOSPITAL_COMMUNITY): Payer: Self-pay | Admitting: *Deleted

## 2021-03-14 NOTE — Telephone Encounter (Signed)
My Chart letter sent outlining instructions for upcoming stress test on 03/18/21 at 10:30. ?

## 2021-03-18 ENCOUNTER — Other Ambulatory Visit: Payer: Self-pay

## 2021-03-18 ENCOUNTER — Ambulatory Visit (HOSPITAL_COMMUNITY): Payer: BC Managed Care – PPO | Attending: Cardiology

## 2021-03-18 DIAGNOSIS — R079 Chest pain, unspecified: Secondary | ICD-10-CM

## 2021-03-18 LAB — MYOCARDIAL PERFUSION IMAGING
Angina Index: 0
Duke Treadmill Score: 7
Estimated workload: 8.6
Exercise duration (min): 7 min
Exercise duration (sec): 4 s
LV dias vol: 63 mL (ref 62–150)
LV sys vol: 22 mL
MPHR: 162 {beats}/min
Nuc Stress EF: 66 %
Peak HR: 162 {beats}/min
Percent HR: 94 %
Rest HR: 90 {beats}/min
Rest Nuclear Isotope Dose: 10.2 mCi
SDS: 2
SRS: 0
SSS: 2
ST Depression (mm): 0 mm
Stress Nuclear Isotope Dose: 32.2 mCi
TID: 0.87

## 2021-03-18 MED ORDER — TECHNETIUM TC 99M TETROFOSMIN IV KIT
32.2000 | PACK | Freq: Once | INTRAVENOUS | Status: AC | PRN
  Administered 2021-03-18: 32.2 via INTRAVENOUS
  Filled 2021-03-18: qty 33

## 2021-03-18 MED ORDER — TECHNETIUM TC 99M TETROFOSMIN IV KIT
10.2000 | PACK | Freq: Once | INTRAVENOUS | Status: AC | PRN
  Administered 2021-03-18: 10.2 via INTRAVENOUS
  Filled 2021-03-18: qty 11

## 2021-04-19 DIAGNOSIS — M5412 Radiculopathy, cervical region: Secondary | ICD-10-CM | POA: Diagnosis not present

## 2021-04-19 DIAGNOSIS — M9903 Segmental and somatic dysfunction of lumbar region: Secondary | ICD-10-CM | POA: Diagnosis not present

## 2021-04-19 DIAGNOSIS — M5416 Radiculopathy, lumbar region: Secondary | ICD-10-CM | POA: Diagnosis not present

## 2021-04-19 DIAGNOSIS — M25561 Pain in right knee: Secondary | ICD-10-CM | POA: Diagnosis not present

## 2021-04-20 DIAGNOSIS — M25561 Pain in right knee: Secondary | ICD-10-CM | POA: Diagnosis not present

## 2021-04-20 DIAGNOSIS — M5416 Radiculopathy, lumbar region: Secondary | ICD-10-CM | POA: Diagnosis not present

## 2021-04-20 DIAGNOSIS — M9903 Segmental and somatic dysfunction of lumbar region: Secondary | ICD-10-CM | POA: Diagnosis not present

## 2021-04-20 DIAGNOSIS — M5412 Radiculopathy, cervical region: Secondary | ICD-10-CM | POA: Diagnosis not present

## 2021-04-21 DIAGNOSIS — M5412 Radiculopathy, cervical region: Secondary | ICD-10-CM | POA: Diagnosis not present

## 2021-04-21 DIAGNOSIS — M5416 Radiculopathy, lumbar region: Secondary | ICD-10-CM | POA: Diagnosis not present

## 2021-04-21 DIAGNOSIS — M25561 Pain in right knee: Secondary | ICD-10-CM | POA: Diagnosis not present

## 2021-04-21 DIAGNOSIS — M9903 Segmental and somatic dysfunction of lumbar region: Secondary | ICD-10-CM | POA: Diagnosis not present

## 2021-04-25 DIAGNOSIS — M5412 Radiculopathy, cervical region: Secondary | ICD-10-CM | POA: Diagnosis not present

## 2021-04-25 DIAGNOSIS — M5416 Radiculopathy, lumbar region: Secondary | ICD-10-CM | POA: Diagnosis not present

## 2021-04-25 DIAGNOSIS — M9903 Segmental and somatic dysfunction of lumbar region: Secondary | ICD-10-CM | POA: Diagnosis not present

## 2021-04-25 DIAGNOSIS — M25561 Pain in right knee: Secondary | ICD-10-CM | POA: Diagnosis not present

## 2021-04-27 DIAGNOSIS — M9903 Segmental and somatic dysfunction of lumbar region: Secondary | ICD-10-CM | POA: Diagnosis not present

## 2021-04-27 DIAGNOSIS — M5412 Radiculopathy, cervical region: Secondary | ICD-10-CM | POA: Diagnosis not present

## 2021-04-27 DIAGNOSIS — M5416 Radiculopathy, lumbar region: Secondary | ICD-10-CM | POA: Diagnosis not present

## 2021-04-27 DIAGNOSIS — M25561 Pain in right knee: Secondary | ICD-10-CM | POA: Diagnosis not present

## 2021-04-29 DIAGNOSIS — M5416 Radiculopathy, lumbar region: Secondary | ICD-10-CM | POA: Diagnosis not present

## 2021-04-29 DIAGNOSIS — M5412 Radiculopathy, cervical region: Secondary | ICD-10-CM | POA: Diagnosis not present

## 2021-04-29 DIAGNOSIS — M9903 Segmental and somatic dysfunction of lumbar region: Secondary | ICD-10-CM | POA: Diagnosis not present

## 2021-04-29 DIAGNOSIS — M25561 Pain in right knee: Secondary | ICD-10-CM | POA: Diagnosis not present

## 2021-05-02 DIAGNOSIS — H0288A Meibomian gland dysfunction right eye, upper and lower eyelids: Secondary | ICD-10-CM | POA: Diagnosis not present

## 2021-05-02 DIAGNOSIS — H0288B Meibomian gland dysfunction left eye, upper and lower eyelids: Secondary | ICD-10-CM | POA: Diagnosis not present

## 2021-05-02 DIAGNOSIS — H16223 Keratoconjunctivitis sicca, not specified as Sjogren's, bilateral: Secondary | ICD-10-CM | POA: Diagnosis not present

## 2021-05-03 DIAGNOSIS — M9903 Segmental and somatic dysfunction of lumbar region: Secondary | ICD-10-CM | POA: Diagnosis not present

## 2021-05-03 DIAGNOSIS — M25561 Pain in right knee: Secondary | ICD-10-CM | POA: Diagnosis not present

## 2021-05-03 DIAGNOSIS — M5416 Radiculopathy, lumbar region: Secondary | ICD-10-CM | POA: Diagnosis not present

## 2021-05-03 DIAGNOSIS — M5412 Radiculopathy, cervical region: Secondary | ICD-10-CM | POA: Diagnosis not present

## 2021-05-04 DIAGNOSIS — M9903 Segmental and somatic dysfunction of lumbar region: Secondary | ICD-10-CM | POA: Diagnosis not present

## 2021-05-04 DIAGNOSIS — M25561 Pain in right knee: Secondary | ICD-10-CM | POA: Diagnosis not present

## 2021-05-04 DIAGNOSIS — M5412 Radiculopathy, cervical region: Secondary | ICD-10-CM | POA: Diagnosis not present

## 2021-05-04 DIAGNOSIS — M5416 Radiculopathy, lumbar region: Secondary | ICD-10-CM | POA: Diagnosis not present

## 2021-05-06 DIAGNOSIS — M5416 Radiculopathy, lumbar region: Secondary | ICD-10-CM | POA: Diagnosis not present

## 2021-05-06 DIAGNOSIS — M9903 Segmental and somatic dysfunction of lumbar region: Secondary | ICD-10-CM | POA: Diagnosis not present

## 2021-05-06 DIAGNOSIS — M25561 Pain in right knee: Secondary | ICD-10-CM | POA: Diagnosis not present

## 2021-05-06 DIAGNOSIS — M5412 Radiculopathy, cervical region: Secondary | ICD-10-CM | POA: Diagnosis not present

## 2021-05-10 DIAGNOSIS — M5412 Radiculopathy, cervical region: Secondary | ICD-10-CM | POA: Diagnosis not present

## 2021-05-10 DIAGNOSIS — M5416 Radiculopathy, lumbar region: Secondary | ICD-10-CM | POA: Diagnosis not present

## 2021-05-10 DIAGNOSIS — M9903 Segmental and somatic dysfunction of lumbar region: Secondary | ICD-10-CM | POA: Diagnosis not present

## 2021-05-10 DIAGNOSIS — M25561 Pain in right knee: Secondary | ICD-10-CM | POA: Diagnosis not present

## 2021-05-11 DIAGNOSIS — M5416 Radiculopathy, lumbar region: Secondary | ICD-10-CM | POA: Diagnosis not present

## 2021-05-11 DIAGNOSIS — M5412 Radiculopathy, cervical region: Secondary | ICD-10-CM | POA: Diagnosis not present

## 2021-05-11 DIAGNOSIS — M9903 Segmental and somatic dysfunction of lumbar region: Secondary | ICD-10-CM | POA: Diagnosis not present

## 2021-05-11 DIAGNOSIS — M25561 Pain in right knee: Secondary | ICD-10-CM | POA: Diagnosis not present

## 2021-05-13 DIAGNOSIS — M25561 Pain in right knee: Secondary | ICD-10-CM | POA: Diagnosis not present

## 2021-05-13 DIAGNOSIS — M9903 Segmental and somatic dysfunction of lumbar region: Secondary | ICD-10-CM | POA: Diagnosis not present

## 2021-05-13 DIAGNOSIS — M5416 Radiculopathy, lumbar region: Secondary | ICD-10-CM | POA: Diagnosis not present

## 2021-05-13 DIAGNOSIS — M5412 Radiculopathy, cervical region: Secondary | ICD-10-CM | POA: Diagnosis not present

## 2021-05-18 DIAGNOSIS — M5416 Radiculopathy, lumbar region: Secondary | ICD-10-CM | POA: Diagnosis not present

## 2021-05-18 DIAGNOSIS — M25561 Pain in right knee: Secondary | ICD-10-CM | POA: Diagnosis not present

## 2021-05-18 DIAGNOSIS — M9903 Segmental and somatic dysfunction of lumbar region: Secondary | ICD-10-CM | POA: Diagnosis not present

## 2021-05-18 DIAGNOSIS — M5412 Radiculopathy, cervical region: Secondary | ICD-10-CM | POA: Diagnosis not present

## 2021-07-25 NOTE — Progress Notes (Deleted)
Cardiology Office Note:    Date:  07/25/2021   ID:  Martin Hancock, DOB 06/14/1975, MRN [id number]  PCP:  Lorrene Reid, PA-C   [facility] Providers Cardiologist:  None     Referring MD: Lorrene Reid, PA-C   CC: Chest pain follow up  History of Present Illness:    Martin Hancock is a 46 y.o. male with a hx of significant alcohol use who presents for evaluation of chest pain. 2023: Negative Exercise NM Stress Test  Patient notes that he is doing ***.   Since last visit notes *** . There are no*** interval hospital/ED visit.    No chest pain or pressure ***.  No SOB/DOE*** and no PND/Orthopnea***.  No weight gain or leg swelling***.  No palpitations or syncope ***.  Ambulatory blood pressure ***.   Past Medical History:  Diagnosis Date   Chest pain    Concussion with loss of consciousness    Family history of colon cancer    Folliculitis    Infected cyst of skin    Obesity    Otalgia of right ear    Sinusitis    Tympanic membrane perforation     Past Surgical History:  Procedure Laterality Date   BACK SURGERY     BACK SURGERY  2003   TYMPANOSTOMY TUBE PLACEMENT      Current Medications: No outpatient medications have been marked as taking for the 07/27/21 encounter (Appointment) with Werner Lean, MD.     Allergies:   Patient has no known allergies.   Social History   Socioeconomic History   Marital status: Married    Spouse name: Not on file   Number of children: Not on file   Years of education: Not on file   Highest education level: Not on file  Occupational History   Not on file  Tobacco Use   Smoking status: Former    Packs/day: 1.00    Years: 15.00    Total pack years: 15.00    Types: Cigarettes    Quit date: 2011    Years since quitting: 12.5   Smokeless tobacco: Never  Vaping Use   Vaping Use: Never used  Substance and Sexual Activity   Alcohol use: Yes    Alcohol/week: 14.0 standard drinks of alcohol    Types:  14 Standard drinks or equivalent per week   Drug use: No   Sexual activity: Yes    Birth control/protection: None  Other Topics Concern   Not on file  Social History Narrative   Not on file   Social Determinants of Health   Financial Resource Strain: Not on file  Food Insecurity: Not on file  Transportation Needs: Not on file  Physical Activity: Not on file  Stress: Not on file  Social Connections: Not on file    Social: Formerly from [place], comes with wife, and kids and plays basketball with them  Family History: The patient's family history includes Colon cancer in his father; Diabetes in his mother; Liver cancer in his father. There is no history of Rectal cancer or Stomach cancer.  ROS:   Please see the history of present illness.     All other systems reviewed and are negative.  EKGs/Labs/Other Studies Reviewed:    The following studies were reviewed today:  EKG:   03/10/21: SR rate 82 WNL  Recent Labs: 03/07/2021: BUN 16; Creatinine, Ser 0.88; Hemoglobin 15.3; Platelets 220; Potassium 3.7; Sodium 136  Recent Lipid Panel  Component Value Date/Time   CHOL 160 12/09/2018 0844   TRIG 74 12/09/2018 0844   HDL 53 12/09/2018 0844   CHOLHDL 3.0 12/09/2018 0844   LDLCALC 93 12/09/2018 0844    Physical Exam:    VS:  There were no vitals taken for this visit.    Wt Readings from Last 3 Encounters:  03/18/21 272 lb (123.4 kg)  03/10/21 272 lb 6.4 oz (123.6 kg)  09/10/19 263 lb 3.2 oz (119.4 kg)    Gen: *** distress, *** obese/well nourished/malnourished   Neck: No JVD, *** carotid bruit Ears: *** Frank Sign Cardiac: No Rubs or Gallops, *** Murmur, ***cardia, *** radial pulses Respiratory: Clear to auscultation bilaterally, *** effort, ***  respiratory rate GI: Soft, nontender, non-distended *** MS: No *** edema; *** moves all extremities Integument: Skin feels *** Neuro:  At time of evaluation, alert and oriented to person/place/time/situation *** Psych:  Normal affect, patient feels ***  ASSESSMENT:    No diagnosis found. PLAN:    CP DOE Former Smoker - may stop ASA        Medication Adjustments/Labs and Tests Ordered: Current medicines are reviewed at length with the patient today.  Concerns regarding medicines are outlined above.  No orders of the defined types were placed in this encounter.  No orders of the defined types were placed in this encounter.   There are no Patient Instructions on file for this visit.   Signed, Werner Lean, MD  07/25/2021 7:21 AM    [place]

## 2021-07-27 ENCOUNTER — Ambulatory Visit: Payer: BC Managed Care – PPO | Admitting: Internal Medicine

## 2021-09-12 ENCOUNTER — Ambulatory Visit: Payer: BC Managed Care – PPO | Admitting: Internal Medicine

## 2022-02-21 DIAGNOSIS — M25512 Pain in left shoulder: Secondary | ICD-10-CM | POA: Diagnosis not present

## 2022-02-21 DIAGNOSIS — M25511 Pain in right shoulder: Secondary | ICD-10-CM | POA: Diagnosis not present

## 2022-03-02 DIAGNOSIS — S46112D Strain of muscle, fascia and tendon of long head of biceps, left arm, subsequent encounter: Secondary | ICD-10-CM | POA: Diagnosis not present

## 2022-03-02 DIAGNOSIS — S46012D Strain of muscle(s) and tendon(s) of the rotator cuff of left shoulder, subsequent encounter: Secondary | ICD-10-CM | POA: Diagnosis not present

## 2022-03-02 DIAGNOSIS — M7542 Impingement syndrome of left shoulder: Secondary | ICD-10-CM | POA: Diagnosis not present

## 2022-04-04 DIAGNOSIS — M7582 Other shoulder lesions, left shoulder: Secondary | ICD-10-CM | POA: Diagnosis not present

## 2022-06-03 DIAGNOSIS — L237 Allergic contact dermatitis due to plants, except food: Secondary | ICD-10-CM | POA: Diagnosis not present

## 2022-09-12 DIAGNOSIS — B0052 Herpesviral keratitis: Secondary | ICD-10-CM | POA: Diagnosis not present

## 2023-01-10 DIAGNOSIS — B0052 Herpesviral keratitis: Secondary | ICD-10-CM | POA: Diagnosis not present

## 2023-06-14 DIAGNOSIS — Z0000 Encounter for general adult medical examination without abnormal findings: Secondary | ICD-10-CM | POA: Diagnosis not present

## 2023-07-12 DIAGNOSIS — Z9189 Other specified personal risk factors, not elsewhere classified: Secondary | ICD-10-CM | POA: Diagnosis not present

## 2023-07-12 DIAGNOSIS — E559 Vitamin D deficiency, unspecified: Secondary | ICD-10-CM | POA: Diagnosis not present

## 2023-07-12 DIAGNOSIS — E782 Mixed hyperlipidemia: Secondary | ICD-10-CM | POA: Diagnosis not present

## 2023-07-12 DIAGNOSIS — R7309 Other abnormal glucose: Secondary | ICD-10-CM | POA: Diagnosis not present

## 2023-07-12 DIAGNOSIS — R0683 Snoring: Secondary | ICD-10-CM | POA: Diagnosis not present

## 2023-07-12 DIAGNOSIS — Z1331 Encounter for screening for depression: Secondary | ICD-10-CM | POA: Diagnosis not present

## 2023-08-02 DIAGNOSIS — E782 Mixed hyperlipidemia: Secondary | ICD-10-CM | POA: Diagnosis not present

## 2023-08-02 DIAGNOSIS — R0683 Snoring: Secondary | ICD-10-CM | POA: Diagnosis not present

## 2023-08-02 DIAGNOSIS — E559 Vitamin D deficiency, unspecified: Secondary | ICD-10-CM | POA: Diagnosis not present

## 2023-08-14 DIAGNOSIS — G4719 Other hypersomnia: Secondary | ICD-10-CM | POA: Diagnosis not present

## 2023-09-04 DIAGNOSIS — R0683 Snoring: Secondary | ICD-10-CM | POA: Diagnosis not present

## 2023-09-04 DIAGNOSIS — E782 Mixed hyperlipidemia: Secondary | ICD-10-CM | POA: Diagnosis not present

## 2023-09-04 DIAGNOSIS — E559 Vitamin D deficiency, unspecified: Secondary | ICD-10-CM | POA: Diagnosis not present

## 2023-09-11 DIAGNOSIS — G4733 Obstructive sleep apnea (adult) (pediatric): Secondary | ICD-10-CM | POA: Diagnosis not present

## 2023-09-20 DIAGNOSIS — G4733 Obstructive sleep apnea (adult) (pediatric): Secondary | ICD-10-CM | POA: Diagnosis not present

## 2023-09-27 DIAGNOSIS — F4322 Adjustment disorder with anxiety: Secondary | ICD-10-CM | POA: Diagnosis not present

## 2023-10-04 DIAGNOSIS — F4322 Adjustment disorder with anxiety: Secondary | ICD-10-CM | POA: Diagnosis not present

## 2023-10-08 DIAGNOSIS — E782 Mixed hyperlipidemia: Secondary | ICD-10-CM | POA: Diagnosis not present

## 2023-10-08 DIAGNOSIS — G4733 Obstructive sleep apnea (adult) (pediatric): Secondary | ICD-10-CM | POA: Diagnosis not present

## 2023-10-08 DIAGNOSIS — E559 Vitamin D deficiency, unspecified: Secondary | ICD-10-CM | POA: Diagnosis not present

## 2023-10-11 DIAGNOSIS — F4322 Adjustment disorder with anxiety: Secondary | ICD-10-CM | POA: Diagnosis not present

## 2023-10-17 DIAGNOSIS — G4733 Obstructive sleep apnea (adult) (pediatric): Secondary | ICD-10-CM | POA: Diagnosis not present

## 2023-10-18 DIAGNOSIS — F4322 Adjustment disorder with anxiety: Secondary | ICD-10-CM | POA: Diagnosis not present

## 2023-11-01 DIAGNOSIS — F4322 Adjustment disorder with anxiety: Secondary | ICD-10-CM | POA: Diagnosis not present

## 2023-11-14 DIAGNOSIS — E559 Vitamin D deficiency, unspecified: Secondary | ICD-10-CM | POA: Diagnosis not present

## 2023-11-14 DIAGNOSIS — G4733 Obstructive sleep apnea (adult) (pediatric): Secondary | ICD-10-CM | POA: Diagnosis not present

## 2023-11-15 DIAGNOSIS — F4322 Adjustment disorder with anxiety: Secondary | ICD-10-CM | POA: Diagnosis not present

## 2023-11-27 DIAGNOSIS — G4733 Obstructive sleep apnea (adult) (pediatric): Secondary | ICD-10-CM | POA: Diagnosis not present

## 2024-02-09 IMAGING — DX DG CHEST 2V
2 series · 2 of 2 positions shown · non-contrast
Comparison: April 11, 2012.

CLINICAL DATA: Chest pain.

EXAM:
CHEST - 2 VIEW

[w chest pa]
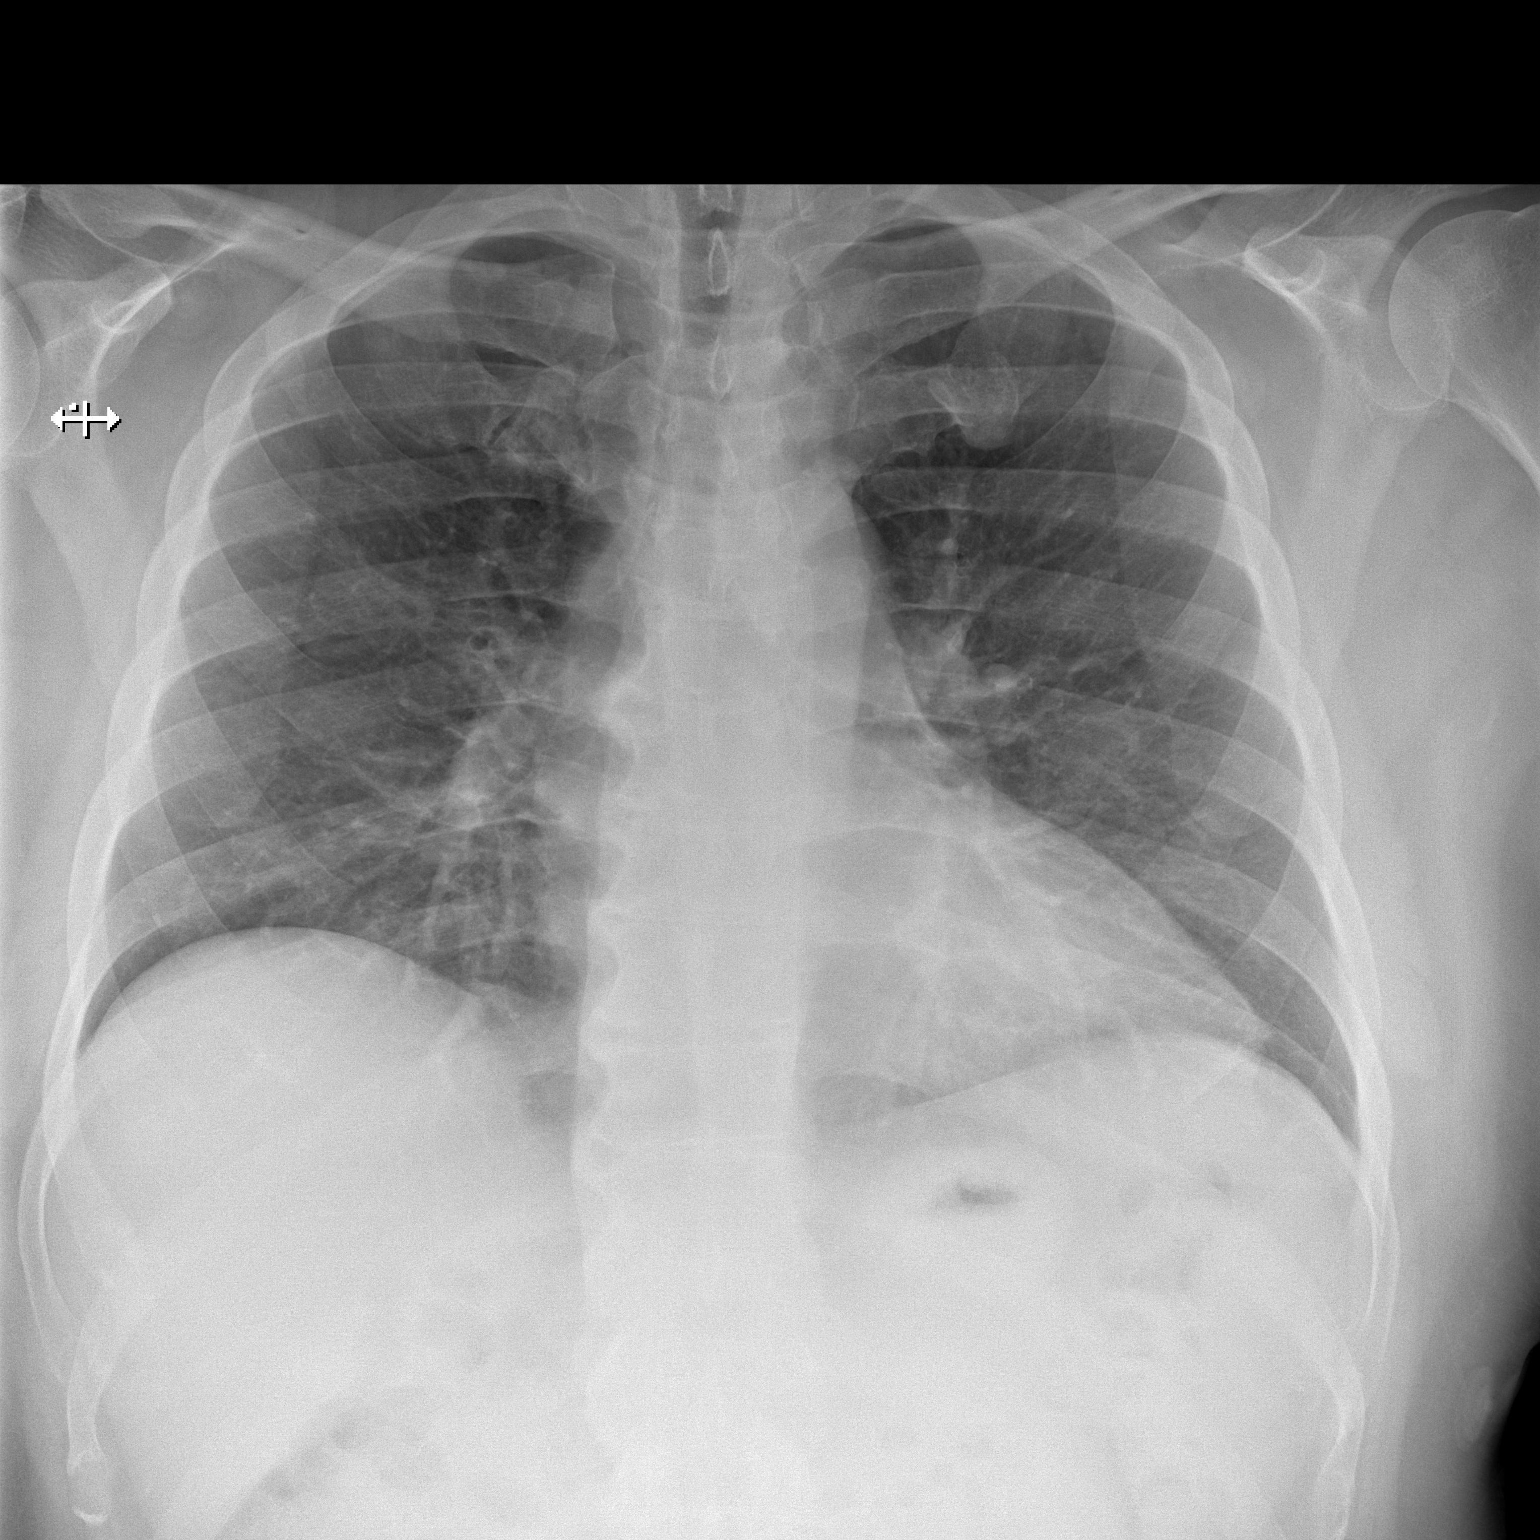

[w chest lat]
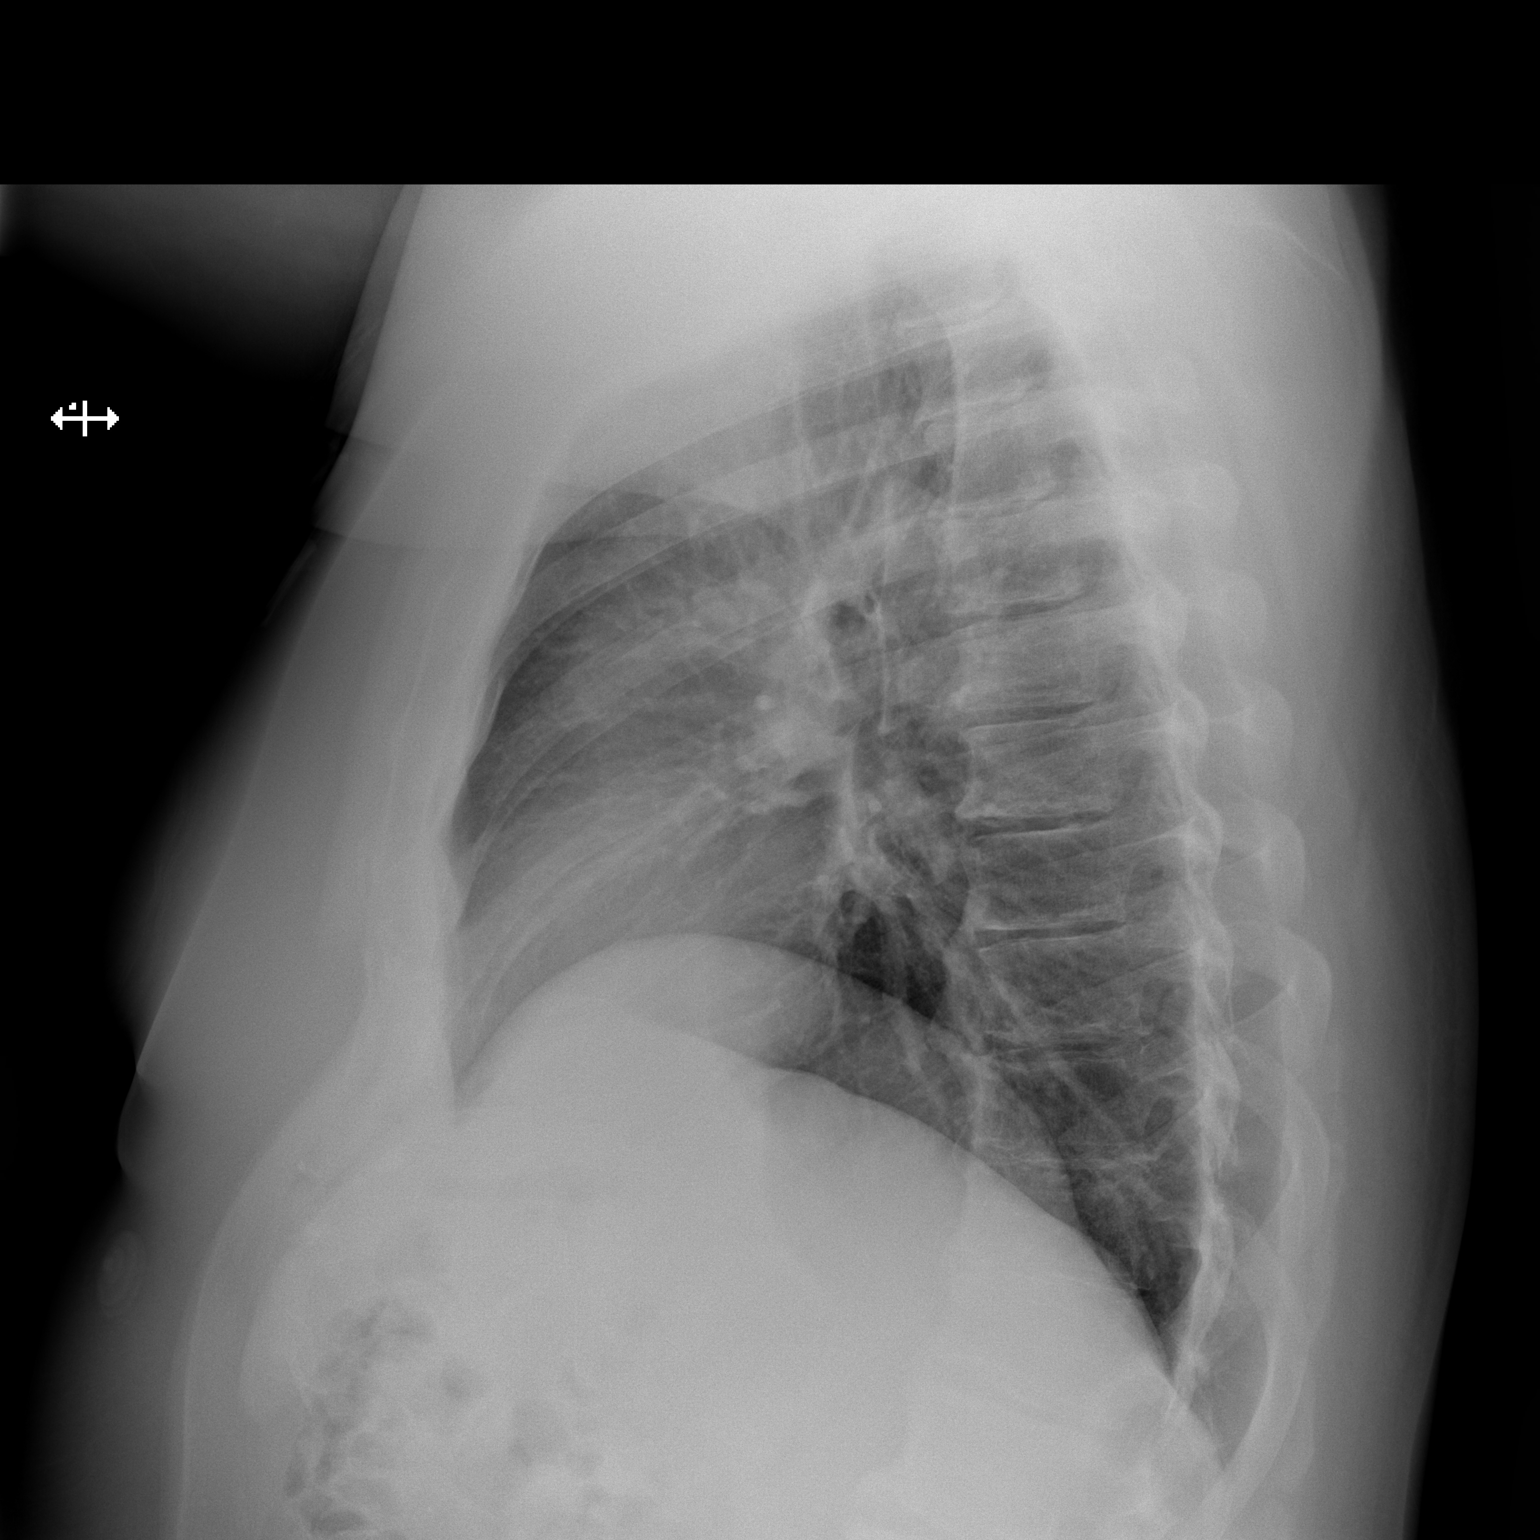

[2 of 2 positions shown; findings below may reference images not displayed]

FINDINGS: The heart size and mediastinal contours are within normal limits.
Both lungs are clear. The visualized skeletal structures are
unremarkable.
IMPRESSION: No active cardiopulmonary disease.
